# Patient Record
Sex: Male | Born: 1980 | Race: Black or African American | Hispanic: No | Marital: Single | Smoking: Never smoker
Health system: Southern US, Community
[De-identification: ages and names within clinical notes are randomized; demographics above are authoritative.]

## PROBLEM LIST (undated history)

## (undated) DIAGNOSIS — F209 Schizophrenia, unspecified: Secondary | ICD-10-CM

## (undated) DIAGNOSIS — F319 Bipolar disorder, unspecified: Secondary | ICD-10-CM

## (undated) DIAGNOSIS — D573 Sickle-cell trait: Secondary | ICD-10-CM

---

## 2004-01-07 ENCOUNTER — Inpatient Hospital Stay (HOSPITAL_COMMUNITY): Admission: AC | Admit: 2004-01-07 | Discharge: 2004-01-13 | Payer: Self-pay

## 2004-01-13 ENCOUNTER — Inpatient Hospital Stay (HOSPITAL_COMMUNITY)
Admission: RE | Admit: 2004-01-13 | Discharge: 2004-01-28 | Payer: Self-pay | Admitting: Physical Medicine & Rehabilitation

## 2004-02-16 ENCOUNTER — Encounter
Admission: RE | Admit: 2004-02-16 | Discharge: 2004-04-18 | Payer: Self-pay | Admitting: Physical Medicine & Rehabilitation

## 2004-02-17 ENCOUNTER — Ambulatory Visit: Payer: Self-pay | Admitting: Physical Medicine & Rehabilitation

## 2004-11-04 ENCOUNTER — Emergency Department (HOSPITAL_COMMUNITY): Admission: EM | Admit: 2004-11-04 | Discharge: 2004-11-04 | Payer: Self-pay | Admitting: Emergency Medicine

## 2004-11-09 ENCOUNTER — Emergency Department (HOSPITAL_COMMUNITY): Admission: EM | Admit: 2004-11-09 | Discharge: 2004-11-09 | Payer: Self-pay | Admitting: Family Medicine

## 2004-12-09 ENCOUNTER — Emergency Department (HOSPITAL_COMMUNITY): Admission: EM | Admit: 2004-12-09 | Discharge: 2004-12-10 | Payer: Self-pay | Admitting: Emergency Medicine

## 2010-06-20 ENCOUNTER — Emergency Department (HOSPITAL_COMMUNITY)
Admission: EM | Admit: 2010-06-20 | Discharge: 2010-06-20 | Payer: Self-pay | Source: Home / Self Care | Admitting: Family Medicine

## 2010-06-20 ENCOUNTER — Emergency Department (HOSPITAL_COMMUNITY)
Admission: EM | Admit: 2010-06-20 | Discharge: 2010-06-20 | Payer: Self-pay | Source: Home / Self Care | Admitting: Emergency Medicine

## 2010-06-27 LAB — HIV ANTIBODY (ROUTINE TESTING W REFLEX): HIV: NONREACTIVE
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
## 2013-05-06 ENCOUNTER — Encounter (HOSPITAL_COMMUNITY): Payer: Self-pay | Admitting: Emergency Medicine

## 2013-05-06 ENCOUNTER — Emergency Department (EMERGENCY_DEPARTMENT_HOSPITAL)
Admission: EM | Admit: 2013-05-06 | Discharge: 2013-05-07 | Disposition: A | Discharge status: Other Acute Inpatient Hospital | Payer: Medicare Other | Source: Home / Self Care | Attending: Emergency Medicine | Admitting: Emergency Medicine

## 2013-05-06 DIAGNOSIS — F29 Unspecified psychosis not due to a substance or known physiological condition: Secondary | ICD-10-CM | POA: Diagnosis present

## 2013-05-06 DIAGNOSIS — Z0289 Encounter for other administrative examinations: Secondary | ICD-10-CM | POA: Insufficient documentation

## 2013-05-06 DIAGNOSIS — F209 Schizophrenia, unspecified: Secondary | ICD-10-CM | POA: Insufficient documentation

## 2013-05-06 DIAGNOSIS — D573 Sickle-cell trait: Secondary | ICD-10-CM | POA: Insufficient documentation

## 2013-05-06 DIAGNOSIS — F319 Bipolar disorder, unspecified: Secondary | ICD-10-CM | POA: Insufficient documentation

## 2013-05-06 HISTORY — DX: Sickle-cell trait: D57.3

## 2013-05-06 HISTORY — DX: Bipolar disorder, unspecified: F31.9

## 2013-05-06 HISTORY — DX: Schizophrenia, unspecified: F20.9
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
## 2013-05-07 ENCOUNTER — Encounter (HOSPITAL_COMMUNITY): Payer: Self-pay | Admitting: *Deleted

## 2013-05-07 ENCOUNTER — Encounter (HOSPITAL_COMMUNITY): Payer: Self-pay | Admitting: Behavioral Health

## 2013-05-07 ENCOUNTER — Inpatient Hospital Stay (HOSPITAL_COMMUNITY)
Admission: AD | Admit: 2013-05-07 | Discharge: 2013-05-14 | DRG: 885 | Disposition: A | Discharge status: Home / Self Care | Payer: Medicare Other | Source: Intra-hospital | Attending: Psychiatry | Admitting: Psychiatry

## 2013-05-07 DIAGNOSIS — F319 Bipolar disorder, unspecified: Secondary | ICD-10-CM | POA: Diagnosis present

## 2013-05-07 DIAGNOSIS — F209 Schizophrenia, unspecified: Principal | ICD-10-CM | POA: Diagnosis present

## 2013-05-07 DIAGNOSIS — F29 Unspecified psychosis not due to a substance or known physiological condition: Secondary | ICD-10-CM

## 2013-05-07 DIAGNOSIS — F4310 Post-traumatic stress disorder, unspecified: Secondary | ICD-10-CM | POA: Diagnosis present

## 2013-05-07 DIAGNOSIS — D573 Sickle-cell trait: Secondary | ICD-10-CM | POA: Diagnosis present

## 2013-05-07 DIAGNOSIS — F203 Undifferentiated schizophrenia: Secondary | ICD-10-CM | POA: Diagnosis present

## 2013-05-07 LAB — CBC WITH DIFFERENTIAL/PLATELET
Basophils Absolute: 0.1 10*3/uL (ref 0.0–0.1)
HCT: 34.4 % — ABNORMAL LOW (ref 39.0–52.0)
Hemoglobin: 12.7 g/dL — ABNORMAL LOW (ref 13.0–17.0)
Lymphocytes Relative: 20 % (ref 12–46)
MCHC: 36.9 g/dL — ABNORMAL HIGH (ref 30.0–36.0)
Monocytes Relative: 12 % (ref 3–12)
RDW: 16.4 % — ABNORMAL HIGH (ref 11.5–15.5)

## 2013-05-07 LAB — RAPID URINE DRUG SCREEN, HOSP PERFORMED
Barbiturates: NOT DETECTED
Cocaine: NOT DETECTED
Tetrahydrocannabinol: NOT DETECTED

## 2013-05-07 LAB — BASIC METABOLIC PANEL
CO2: 24 mEq/L (ref 19–32)
Chloride: 102 mEq/L (ref 96–112)
Creatinine, Ser: 1.18 mg/dL (ref 0.50–1.35)
Glucose, Bld: 118 mg/dL — ABNORMAL HIGH (ref 70–99)
Potassium: 3.4 mEq/L — ABNORMAL LOW (ref 3.5–5.1)
Sodium: 137 mEq/L (ref 135–145)

## 2013-05-07 MED ORDER — ALUM & MAG HYDROXIDE-SIMETH 200-200-20 MG/5ML PO SUSP: 30.0000 mL | ORAL | Status: DC | PRN

## 2013-05-07 MED ORDER — ACETAMINOPHEN 325 MG PO TABS: 650.0000 mg | ORAL_TABLET | Freq: Four times a day (QID) | ORAL | Status: DC | PRN

## 2013-05-07 MED ORDER — ONDANSETRON HCL 4 MG PO TABS: 4.0000 mg | ORAL_TABLET | Freq: Three times a day (TID) | ORAL | Status: DC | PRN

## 2013-05-07 MED ORDER — MAGNESIUM HYDROXIDE 400 MG/5ML PO SUSP: 30.0000 mL | Freq: Every day | ORAL | Status: DC | PRN

## 2013-05-07 MED ORDER — IBUPROFEN 200 MG PO TABS: 600.0000 mg | ORAL_TABLET | Freq: Three times a day (TID) | ORAL | Status: DC | PRN

## 2013-05-07 MED ORDER — TRAZODONE HCL 50 MG PO TABS
50.0000 mg | ORAL_TABLET | Freq: Every evening | ORAL | Status: DC | PRN
  Filled 2013-05-07: qty 3

## 2013-05-07 MED ORDER — LORAZEPAM 1 MG PO TABS: 1.0000 mg | ORAL_TABLET | Freq: Three times a day (TID) | ORAL | Status: DC | PRN

## 2013-05-07 MED ORDER — ZOLPIDEM TARTRATE 5 MG PO TABS: 5.0000 mg | ORAL_TABLET | Freq: Every evening | ORAL | Status: DC | PRN

## 2013-05-07 NOTE — ED Provider Notes (Signed)
Medical screening examination/treatment/procedure(s) were conducted as a shared visit with non-physician practitioner(s) and myself.  I personally evaluated the patient during the encounter.  EKG Interpretation   None       32 yo male presenting with hx of schizophrenia presenting with police due to bizarre behavior. On exam, nontoxic, but muttering numbers, referring to caregivers as "Mr. Hermelinda Medicus", and talking about fire.  Plan Psychiatric consultation.    Clinical Impression: 1. Schizophrenia       Candyce Churn, MD 05/07/13 2019
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
## 2013-05-07 NOTE — [facility] Counselor (Signed)
Pt will need to be involuntarily committed. Pt.'s current mental capacity prevents him from coherently signing voluntary admission and consent form.
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
## 2013-05-07 NOTE — ED Provider Notes (Signed)
CSN: [id number]     Arrival date & time 05/06/13  2351 History   First MD Initiated Contact with Patient 05/07/13 0033     Chief Complaint  Patient presents with  . Medical Clearance   HPI  History provided by the patient and [employer]. Patient is a 32 year old male with past history of schizophrenia and bipolar disorder who presents in the custody of [employer]. Patient was found on someone's property after they returned home. Initially they thought the patient may be a burglar however when police arrived patient was mumbling and counting numbers and not making sense to the officers. Patient reports to me that he is homeless and had his sleeping gear on someone's property.  He states this is caused by "social services spawning the root of the problem" and that things would be improved if "a woman ran things". Patient denies taking any medications. Denies any drug or alcohol use. He denies any SI or HI. No other aggravating or alleviating factors. No other associated symptoms.   Past Medical History  Diagnosis Date  . Sickle cell trait   . Bipolar 1 disorder   . Schizophrenia    History reviewed. No pertinent past surgical history. No family history on file. History  Substance Use Topics  . Smoking status: Never Smoker   . Smokeless tobacco: Not on file  . Alcohol Use: No    Review of Systems  Constitutional: Negative for fever.  Respiratory: Negative for shortness of breath.   Cardiovascular: Negative for chest pain.  Neurological: Negative for headaches.  All other systems reviewed and are negative.    Allergies  Review of patient's allergies indicates no known allergies.  Home Medications  No current outpatient prescriptions on file. BP 127/82  Pulse 95  Temp(Src) 99 F (37.2 C)  Resp 16  SpO2 97% Physical Exam  Nursing note and vitals reviewed. Constitutional: He is oriented to person, place, and time. He appears well-developed and well-nourished.  HENT:  Head: Normocephalic  and atraumatic.  Eyes: Conjunctivae and EOM are normal. Pupils are equal, round, and reactive to light.  Neck: Normal range of motion. Neck supple.  No meningeal signs  Cardiovascular: Normal rate and regular rhythm.   Pulmonary/Chest: Effort normal and breath sounds normal. No respiratory distress. He has no wheezes. He has no rales.  Abdominal: Soft.  Musculoskeletal: Normal range of motion.  Neurological: He is alert and oriented to person, place, and time.  Skin: Skin is warm.  Psychiatric: His behavior is normal. His mood appears anxious. Thought content is paranoid. He expresses no homicidal and no suicidal ideation.    ED Course  Procedures   DIAGNOSTIC STUDIES: Oxygen Saturation is 97% on room air.    COORDINATION OF CARE:  Nursing notes reviewed. Vital signs reviewed. Initial pt interview and examination performed.   1:33 AM-patient seen and evaluated. Patient with reported history of schizophrenia and bipolar disorder currently not on any medication. Patient with flight of ideas and some paranoia. Denies SI or HI. Discussed work up plan with pt at bedside, which includes medical clearance and TTS evaluation. Pt agrees with plan.  Patient medically cleared without signs for emergent medical condition. He is stable for further psychiatric evaluation. Psychiatric holding orders in place. TTS consult ordered.     Results for orders placed during the hospital encounter of 05/06/13  CBC WITH DIFFERENTIAL      Result Value Range   WBC 12.0 (*) 4.0 - 10.5 K/uL   RBC 4.60  4.22 - 5.81 MIL/uL   Hemoglobin 12.7 (*) 13.0 - 17.0 g/dL   HCT 42.5 (*) 95.6 - 38.7 %   MCV 74.8 (*) 78.0 - 100.0 fL   MCH 27.6  26.0 - 34.0 pg   MCHC 36.9 (*) 30.0 - 36.0 g/dL   RDW 56.4 (*) 33.2 - 95.1 %   Platelets 304  150 - 400 K/uL   Neutrophils Relative % 66  43 - 77 %   Lymphocytes Relative 20  12 - 46 %   Monocytes Relative 12  3 - 12 %   Eosinophils Relative 1  0 - 5 %   Basophils Relative  1  0 - 1 %   Neutro Abs 8.0 (*) 1.7 - 7.7 K/uL   Lymphs Abs 2.4  0.7 - 4.0 K/uL   Monocytes Absolute 1.4 (*) 0.1 - 1.0 K/uL   Eosinophils Absolute 0.1  0.0 - 0.7 K/uL   Basophils Absolute 0.1  0.0 - 0.1 K/uL   RBC Morphology POLYCHROMASIA PRESENT    BASIC METABOLIC PANEL      Result Value Range   Sodium 137  135 - 145 mEq/L   Potassium 3.4 (*) 3.5 - 5.1 mEq/L   Chloride 102  96 - 112 mEq/L   CO2 24  19 - 32 mEq/L   Glucose, Bld 118 (*) 70 - 99 mg/dL   BUN 16  6 - 23 mg/dL   Creatinine, Ser 8.84  0.50 - 1.35 mg/dL   Calcium 9.2  8.4 - 16.6 mg/dL   GFR calc non Af Amer 81 (*) >90 mL/min   GFR calc Af Amer >90  >90 mL/min  URINE RAPID DRUG SCREEN (HOSP PERFORMED)      Result Value Range   Opiates NONE DETECTED  NONE DETECTED   Cocaine NONE DETECTED  NONE DETECTED   Benzodiazepines NONE DETECTED  NONE DETECTED   Amphetamines NONE DETECTED  NONE DETECTED   Tetrahydrocannabinol NONE DETECTED  NONE DETECTED   Barbiturates NONE DETECTED  NONE DETECTED  ETHANOL      Result Value Range   Alcohol, Ethyl (B) <11  0 - 11 mg/dL        MDM   1. Schizophrenia        Angus Seller, PA-C 05/07/13 0424

## 2013-05-07 NOTE — BHH Counselor (Signed)
[facility] called and said that pt's bed is ready, 405-1 Donell Sievert PA-C to [employer]. Writer notified pt's RN. Pt declined signing consent to release info form. Writer notified [facility] extenders in Psych ED.  Evette Cristal, [place] Assessment Counselor
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
## 2013-05-07 NOTE — Progress Notes (Signed)
Admission note: Pt presents with rapid, pressured speech, disorganized thoughts, flight of ideas and poor insight. Pt is delusional. Pt stated, "Have you seen the movie Jumpers, the people in this hospital are crazy just like that movie". Pt stated that he took medications when he was little and it made him crazy. Per pt, he's not taking any meds while he is here. Pt had difficulty concentrating and staying on task during admission process. During skin assessment, pt would not put on gown. Pt stood behind the curtain whit his hands up in the air w/o the gown on and stated to writer "you are the first person to see me naked, I'm a virgin". Pt denies using alcohol or any illicit drugs. Pt explained the policy here at [facility]. Pt safely brought onto the unit. Pt safety maintained.

## 2013-05-07 NOTE — ED Provider Notes (Signed)
Medical screening examination/treatment/procedure(s) were conducted as a shared visit with non-physician practitioner(s) and myself.  I personally evaluated the patient during the encounter.   Please see my separate note.     Candyce Churn, MD 05/07/13 2043

## 2013-05-07 NOTE — Care Management Utilization Note (Signed)
Per State Regulation 482.30  The chart was reviewed for necessity with respect to the patient's Admission/ Duration of stay.Admission 05/07/2013  Next Review Date:05/10/2013  Lacinda Axon, RN, BSN

## 2013-05-07 NOTE — ED Notes (Signed)
Patient was transferred by [facility] to [facility] room 405-01. Patient is IVC and [employer] received IVC paperwork in hand. Patient left with no signs of distress noted. Patient belongings were returned to patient. Patient denies SI/HI and A/V hallucinations and denies pain. Hand off report was given to Financial controller at [facility].

## 2013-05-07 NOTE — BHH Group Notes (Signed)
[facility] Group Therapy  05/07/2013 , 3:04 PM    Type of Therapy:  [facility] Presentation  Participation Level:  Active  Participation Quality:  Attentive  Affect:  Blunted  Cognitive:  Oriented  Insight:  Limited  Engagement in Therapy:  Engaged  Modes of Intervention:  Discussion, Education and Socialization  Summary of Progress/Problems:  Onalee Hua from [employer] came to present his recovery story and play the guitar.   Sat quietly through group.  Nodded off several times.  [name] B 05/07/2013 , 3:04 PM

## 2013-05-07 NOTE — Tx Team (Signed)
Initial Interdisciplinary Treatment Plan  PATIENT STRENGTHS: (choose at least two) Ability for insight Supportive family/friends  PATIENT STRESSORS: homeless   PROBLEM LIST: Problem List/Patient Goals Date to be addressed Date deferred Reason deferred Estimated date of resolution  Rapid pressured speech 05/07/13     Delusions  05/07/13                                                DISCHARGE CRITERIA:  Ability to meet basic life and health needs Adequate post-discharge living arrangements Improved stabilization in mood, thinking, and/or behavior Verbal commitment to aftercare and medication compliance  PRELIMINARY DISCHARGE PLAN: Attend aftercare/continuing care group Attend PHP/IOP  PATIENT/FAMIILY INVOLVEMENT: This treatment plan has been presented to and reviewed with the patient, Demarr Kluever, and/or family member.  The patient and family have been given the opportunity to ask questions and make suggestions.  Woodford Strege L 05/07/2013, 12:10 PM

## 2013-05-07 NOTE — ED Notes (Signed)
Patient denies SI, HI, AVH. Oriented x3. Unable to contract for safety. States he would need to think about it and "would want a relationship". Denies anxiety, hopelessness and depression. Denies drug and alcohol use. Patient is unfocused, tangential. States he has no support system and has not had housing since 2008. Patient states " the police arrived when I was trying to take a nap". Patient states he does not know why the police brought him to the hospital.  Patient safety maintained, Q 15 minute checks.

## 2013-05-07 NOTE — Progress Notes (Signed)
D: Patient in his room asleep on approach.  Patient appears paranoid and he is preoccupied with his clothing.  Patient forward little information and he is guarded.  Patient asked about his clothing several times but writer did not see clothing in the laundry room or in his room.  Patient denies SI/HI and denies AVH but patient paranoid.   A: Staff to monitor Q 15 mins for safety.  Encouragement and support offered.  No scheduled medications administered per orders. R: Patient remains safe on the unit.  Patient did not attend group tonight.  Patient did not have scheduled medications tonight.  Patient not visible on the unit tonight.
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
## 2013-05-08 DIAGNOSIS — F4310 Post-traumatic stress disorder, unspecified: Secondary | ICD-10-CM | POA: Diagnosis present

## 2013-05-08 DIAGNOSIS — F203 Undifferentiated schizophrenia: Secondary | ICD-10-CM | POA: Diagnosis present

## 2013-05-08 MED ORDER — OLANZAPINE 10 MG PO TBDP
10.0000 mg | ORAL_TABLET | Freq: Three times a day (TID) | ORAL | Status: DC | PRN
  Administered 2013-05-09 – 2013-05-11 (×2): 10 mg via ORAL
  Filled 2013-05-08 (×3): qty 1

## 2013-05-08 MED ORDER — CITALOPRAM HYDROBROMIDE 10 MG PO TABS
10.0000 mg | ORAL_TABLET | Freq: Every day | ORAL | Status: DC
  Administered 2013-05-10 – 2013-05-14 (×5): 10 mg via ORAL
  Filled 2013-05-08 (×2): qty 1
  Filled 2013-05-08: qty 3
  Filled 2013-05-08 (×6): qty 1
  Filled 2013-05-08: qty 3

## 2013-05-08 MED ORDER — HALOPERIDOL 2 MG PO TABS
2.0000 mg | ORAL_TABLET | Freq: Every day | ORAL | Status: DC
  Filled 2013-05-08 (×5): qty 1

## 2013-05-08 NOTE — BHH Suicide Risk Assessment (Signed)
Suicide Risk Assessment  Admission Assessment     Nursing information obtained from:  Patient Demographic factors:  Male;Low socioeconomic status Current Mental Status:  NA Loss Factors:  NA Historical Factors:  NA Risk Reduction Factors:  NA  CLINICAL FACTORS:   Depression:   Delusional Hopelessness Schizophrenia:   Paranoid or undifferentiated type Currently Psychotic  COGNITIVE FEATURES THAT CONTRIBUTE TO RISK:  Closed-mindedness Polarized thinking    SUICIDE RISK:   Minimal: No identifiable suicidal ideation.  Patients presenting with no risk factors but with morbid ruminations; may be classified as minimal risk based on the severity of the depressive symptoms  PLAN OF CARE:1. Admit for crisis management and stabilization. 2. Medication management to reduce current symptoms to base line and improve the     patient's overall level of functioning 3. Treat health problems as indicated. 4. Develop treatment plan to decrease risk of relapse upon discharge and the need for     readmission. 5. Psycho-social education regarding relapse prevention and self care. 6. Health care follow up as needed for medical problems. 7. Restart home medications where appropriate.   I certify that inpatient services furnished can reasonably be expected to improve the patient's condition.  Thedore Mins, MD 05/08/2013, 11:41 AM
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
## 2013-05-09 DIAGNOSIS — F4310 Post-traumatic stress disorder, unspecified: Secondary | ICD-10-CM

## 2013-05-09 DIAGNOSIS — F209 Schizophrenia, unspecified: Principal | ICD-10-CM

## 2013-05-09 NOTE — Progress Notes (Signed)
D. Pt visible in milieu this evening, attended evening wrap-up group and spoke about how he is fine and feels "great". Pt does appear somewhat guarded and paranoid and refused hs haldol. A. Explained and educated pt about the medications and pt verbalized he was in [place] in the past and was on zyprexa and agreed to take that instead of the haldol. Drenda Freeze, NP, notified of request and said it was ok to give zyprexa that was ordered PRN. R. Pt did take zyprexa without incident this evening.
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
## 2013-05-09 NOTE — Progress Notes (Signed)
D  Pt is disorganized and preoccupied in his thought processes   He talks about imprinting and that he doesn't need to take medications   He refused his nighttime medications   He has been isolative to his room   He is polite and cooperative when approached but insists he does not need to be here and that everything was good for him A   Verbal support    Medications offered and discussed theraputic use   Q 15 min checks R   Pt safe at present

## 2013-05-09 NOTE — Progress Notes (Signed)
D: Patient denies SI/HI and auditory and visual hallucinations. The patient has a preoccupied mood and affect. The patient is paranoid today and is guarded when interacting with staff. However, when interacting with staff the patient remains pleasant. The patient states that he "doesn't need medication" and that he "will not take it." The patient is attending groups on the unit.   A: Patient given emotional support from RN. Patient encouraged to come to staff with concerns and/or questions. Patient's medication routine continued. Patient's orders and plan of care reviewed.  R: Patient remains safe. Will continue to monitor patient q15 minutes for safety.

## 2013-05-09 NOTE — Progress Notes (Signed)
The focus of this group is to help patients review their daily goal of treatment and discuss progress on daily workbooks. Pt did not attend this group.

## 2013-05-10 NOTE — [facility] Group Notes (Signed)
[facility] Group Notes: (Clinical Social Work)   05/10/2013      Type of Therapy:  Group Therapy   Participation Level:  Did Not Attend    Ambrose Mantle, LCSW 05/10/2013, 12:43 PM

## 2013-05-10 NOTE — Progress Notes (Signed)
Patient ID: Albert Anderson, male   DOB: Oct 12, 1980, 32 y.o.   MRN: [id number] Psychoeducational Group Note  Date:  05/10/2013 Time:0930am  Group Topic/Focus:  Identifying Needs:   The focus of this group is to help patients identify their personal needs that have been historically problematic and identify healthy behaviors to address their needs.  Participation Level:  Did Not Attend  Participation Quality:    Affect: Cognitive:  Insight: Engagement in Group:  Additional Comments:  Psychoeducational group - healthy coping skills  Valente David 05/10/2013,9:58 AM

## 2013-05-10 NOTE — Progress Notes (Signed)
Patient ID: Albert Anderson, male   DOB: [birth year], 32 y.o.   MRN: [id number] [facility] MD Progress Note  05/10/2013 10:14 AM Albert Anderson  MRN:  [id number] Subjective:  Patient seen in his room in bed. He is drowsy and sluggish. States he just took his zyprexa and he isn't going to take anything else. He pulls the covers up and rolls over. He does not feel he needs to take his medication. He says he is fine and he is ready to go. Objective: Patient has not been attending groups, has been refusing medication. He has no insight, continues to be delusional, but otherwise cooperative.  Diagnosis:   DSM5: Schizophrenia Disorders: Schizophrenia (295.7)  Obsessive-Compulsive Disorders:  Trauma-Stressor Disorders: Posttraumatic Stress Disorder (309.81)  Substance/Addictive Disorders:  Depressive Disorders:  AXIS I: Undifferentiated schizophrenia  AXIS II: Deferred  AXIS III:  Past Medical History   Diagnosis  Date   .  Sickle cell trait    .  Bipolar 1 disorder    .  Schizophrenia     AXIS IV: economic problems, housing problems, occupational problems, other psychosocial or environmental problems and problems with primary support group  AXIS V: 31-40 impairment in reality testing   ADL's:  Intact  Sleep: Good  Appetite:  Good  Suicidal Ideation:  Denies Homicidal Ideation:  Denies AEB (as evidenced by):  Psychiatric Specialty Exam: Review of Systems  Constitutional: Negative.   HENT: Negative.   Eyes: Negative.   Respiratory: Negative.   Cardiovascular: Negative.   Gastrointestinal: Negative.   Genitourinary: Negative.   Musculoskeletal: Negative.   Skin: Negative.   Neurological: Negative.   Endo/Heme/Allergies: Negative.     Blood pressure 115/70, pulse 93, temperature 98.2 F (36.8 C), temperature source Oral, resp. rate 18, height 5\' 5"  (1.651 m), weight 72.576 kg (160 lb).Body mass index is 26.63 kg/(m^2).  General Appearance: Casual  Eye Contact::  Fair  Speech:   Clear and Coherent  Volume:  Normal  Mood:  Depressed  Affect:  Blunt  Thought Process:  Disorganized  Orientation:  Full (Time, Place, and Person)  Thought Content:  Delusions and Paranoid Ideation  Suicidal Thoughts:  No  Homicidal Thoughts:  No  Memory:  Immediate;   Fair Recent;   Fair Remote;   Fair  Judgement:  Impaired  Insight:  Lacking  Psychomotor Activity:  Normal  Concentration:  Fair  Recall:  Fair  Akathisia:  No  Handed:  Right  AIMS (if indicated):     Assets:  Communication Skills Desire for Improvement Leisure Time Physical Health Resilience  Sleep:  Number of Hours: 6.5   Current Medications: Current Facility-Administered Medications  Medication Dose Route Frequency Provider Last Rate Last Dose  . acetaminophen (TYLENOL) tablet 650 mg  650 mg Oral Q6H PRN Shuvon Rankin, NP      . alum & mag hydroxide-simeth (MAALOX/MYLANTA) 200-200-20 MG/5ML suspension 30 mL  30 mL Oral Q4H PRN Shuvon Rankin, NP      . citalopram (CELEXA) tablet 10 mg  10 mg Oral Daily Mojeed Akintayo   10 mg at 05/10/13 0817  . haloperidol (HALDOL) tablet 2 mg  2 mg Oral QHS Mojeed Akintayo      . magnesium hydroxide (MILK OF MAGNESIA) suspension 30 mL  30 mL Oral Daily PRN Shuvon Rankin, NP      . OLANZapine zydis (ZYPREXA) disintegrating tablet 10 mg  10 mg Oral Q8H PRN Mojeed Akintayo   10 mg at 05/09/13 2154  . traZODone (DESYREL) tablet 50  mg  50 mg Oral QHS PRN Fransisca Kaufmann, NP        Lab Results: No results found for this or any previous visit (from the past 48 hour(s)).  Physical Findings: AIMS: Facial and Oral Movements Muscles of Facial Expression: None, normal Lips and Perioral Area: None, normal Jaw: None, normal Tongue: None, normal,Extremity Movements Upper (arms, wrists, hands, fingers): None, normal Lower (legs, knees, ankles, toes): None, normal, Trunk Movements Neck, shoulders, hips: None, normal, Overall Severity Severity of abnormal movements (highest score from  questions above): None, normal Incapacitation due to abnormal movements: None, normal Patient's awareness of abnormal movements (rate only patient's report): No Awareness, Dental Status Current problems with teeth and/or dentures?: No Does patient usually wear dentures?: No  CIWA:    COWS:     Treatment Plan Summary: Daily contact with patient to assess and evaluate symptoms and progress in treatment Medication management  Plan: 1. Will increase dose or change to Risperdal or Haldol if second opinion is completed. 2. Will follow. Medical Decision Making Problem Points:  Established problem, stable/improving (1) and Review of psycho-social stressors (1) Data Points:  Review of medication regiment & side effects (2)  I certify that inpatient services furnished can reasonably be expected to improve the patient's condition.   Rona Ravens. Mashburn RPAC 05/10/2013, 10:14 AM  Attending Addendum:  I have discussed this patient with the above provider. I have reviewed the history, physical exam, assessment and plan and agree with the above.  Jacqulyn Cane, M.D.  05/10/2013 10:14 AM

## 2013-05-10 NOTE — Progress Notes (Signed)
D: Patient lying in bed with eyes closed. Respirations even and non-labored. A: Staff will monitor on q 15 minute checks, follow treatment plan, and give meds as ordered. R: Appears asleep  

## 2013-05-10 NOTE — Progress Notes (Signed)
Did not attend group 

## 2013-05-10 NOTE — Progress Notes (Signed)
Patient ID: Albert Anderson, male   DOB: April 04, 1981, 32 y.o.   MRN: [id number] 05-10-13 nursing shift note: D: when pt came to the medication window he was very resistant to taking medications. He was lethargic and seemed preoccupied with his thoughts. It was difficult to get and keep his attention. A: after some encouragement from RN and other staff the pt took his medication. R: he remains in his room, is not coming to groups or interacting with other.  He remains disorganized and delusional. RN will monitor and Q 15 min ck's continue.

## 2013-05-11 MED ORDER — OLANZAPINE 5 MG PO TABS
5.0000 mg | ORAL_TABLET | Freq: Once | ORAL | Status: AC
  Administered 2013-05-11: 5 mg via ORAL
  Filled 2013-05-11 (×2): qty 1

## 2013-05-11 MED ORDER — OLANZAPINE 7.5 MG PO TABS
15.0000 mg | ORAL_TABLET | Freq: Every day | ORAL | Status: DC
  Administered 2013-05-12 – 2013-05-13 (×2): 15 mg via ORAL
  Filled 2013-05-11 (×4): qty 2

## 2013-05-11 NOTE — Progress Notes (Signed)
Patient ID: Bain Whichard, male   DOB: May 16, 1981, 32 y.o.   MRN: [id number] Psychoeducational Group Note  Date:  05/11/2013 Time:  1000am  Group Topic/Focus:  Making Healthy Choices:   The focus of this group is to help patients identify negative/unhealthy choices they were using prior to admission and identify positive/healthier coping strategies to replace them upon discharge.  Participation Level:  Minimal  Participation Quality:  Appropriate  Affect:  Labile  Cognitive:  Disorganized  Insight:  Resistant  Engagement in Group:  Resistant  Additional Comments:  Inventory and Psychoeducational group   Valente David 05/11/2013,11:22 AM

## 2013-05-11 NOTE — Progress Notes (Signed)
Adult Psychoeducational Group Note  Date:  05/11/2013 Time:  9:10 PM  Group Topic/Focus:  Wrap-Up Group:   The focus of this group is to help patients review their daily goal of treatment and discuss progress on daily workbooks.  Participation Level:  Minimal  Participation Quality:  Drowsy  Affect:  Flat  Cognitive:  Lacking  Insight: Lacking  Engagement in Group:  Lacking  Modes of Intervention:  Discussion  Additional Comments:  Pt stated that he a boring day.  Pt also mention that his goal was to try to stay focus on life.  Pt stated that he has a good support sytem that includes his family  [name] 05/11/2013, 9:10 PM

## 2013-05-11 NOTE — Progress Notes (Signed)
Spoke briefly with pt as he does not engage other than to ask about "the meds I have to take." Although pt is not agitated, did offer zyprexa prn to aid in sleep. Pt focused on doing what he needs to do for discharge. Asking "if I don't take the medicine will you force an injection on me?" Little to no insight. Did attend group. Forwarded little info with this Clinical research associate outside of denying all psychiatric symptoms. Remains preoccupied and paranoid. Lawrence Marseilles

## 2013-05-11 NOTE — Progress Notes (Signed)
Patient ID: Albert Anderson, male   DOB: 1981-05-12, 32 y.o.   MRN: [id number] [facility] MD Progress Note  05/11/2013 7:53 PM Albert Anderson  MRN:  [id number] Subjective: Patient is observed in group as well as seen 1:1. He is continuing to be resistant to medication, but has remained cooperative. He states he took his Zyprexa yesterday, but med rec does not support this. He does not want to take the Haldol but will agree to take the Zyprexa.  He has taken 10mg  of Zyprexa this AM.   Objective: Patient has not been attending groups, has been refusing medication. He has no insight, continues to be delusional, but otherwise cooperative.  Diagnosis:  Albert Anderson continues to be disorganized. He is cooperative with this provider and agrees to take Zyprexa if given daily. He mentions a number of sexually inappropriate statements, but can be redirected. DSM5: Schizophrenia Disorders: Schizophrenia (295.7)  Obsessive-Compulsive Disorders:  Trauma-Stressor Disorders: Posttraumatic Stress Disorder (309.81)  Substance/Addictive Disorders:  Depressive Disorders:  AXIS I: Undifferentiated schizophrenia  AXIS II: Deferred  AXIS III:  Past Medical History   Diagnosis  Date   .  Sickle cell trait    .  Bipolar 1 disorder    .  Schizophrenia     AXIS IV: economic problems, housing problems, occupational problems, other psychosocial or environmental problems and problems with primary support group  AXIS V: 31-40 impairment in reality testing   ADL's:  Intact  Sleep: Good  Appetite:  Good  Suicidal Ideation:  Denies Homicidal Ideation:  Denies AEB (as evidenced by):  Psychiatric Specialty Exam: Review of Systems  Constitutional: Negative.   HENT: Negative.   Eyes: Negative.   Respiratory: Negative.   Cardiovascular: Negative.   Gastrointestinal: Negative.   Genitourinary: Negative.   Musculoskeletal: Negative.   Skin: Negative.   Neurological: Negative.   Endo/Heme/Allergies: Negative.      Blood pressure 119/76, pulse 53, temperature 97.5 F (36.4 C), temperature source Oral, resp. rate 17, height 5\' 5"  (1.651 m), weight 72.576 kg (160 lb).Body mass index is 26.63 kg/(m^2).  General Appearance: Casual  Eye Contact::  Fair  Speech:  Clear and Coherent  Volume:  Normal  Mood:  Depressed  Affect:  Blunt  Thought Process:  Disorganized  Orientation:  Full (Time, Place, and Person)  Thought Content:  Delusions and Paranoid Ideation  Suicidal Thoughts:  No  Homicidal Thoughts:  No  Memory:  Immediate;   Fair Recent;   Fair Remote;   Fair  Judgement:  Impaired  Insight:  Lacking  Psychomotor Activity:  Normal  Concentration:  Fair  Recall:  Fair  Akathisia:  No  Handed:  Right  AIMS (if indicated):     Assets:  Communication Skills Desire for Improvement Leisure Time Physical Health Resilience  Sleep:  Number of Hours: 6   Current Medications: Current Facility-Administered Medications  Medication Dose Route Frequency Provider Last Rate Last Dose  . acetaminophen (TYLENOL) tablet 650 mg  650 mg Oral Q6H PRN Shuvon Rankin, NP      . alum & mag hydroxide-simeth (MAALOX/MYLANTA) 200-200-20 MG/5ML suspension 30 mL  30 mL Oral Q4H PRN Shuvon Rankin, NP      . citalopram (CELEXA) tablet 10 mg  10 mg Oral Daily Mojeed Akintayo   10 mg at 05/11/13 0812  . magnesium hydroxide (MILK OF MAGNESIA) suspension 30 mL  30 mL Oral Daily PRN Shuvon Rankin, NP      . [START ON 05/12/2013] OLANZapine (ZYPREXA) tablet 15 mg  15  mg Oral Daily Verne Spurr, PA-C      . OLANZapine zydis (ZYPREXA) disintegrating tablet 10 mg  10 mg Oral Q8H PRN Mojeed Akintayo   10 mg at 05/11/13 0900  . traZODone (DESYREL) tablet 50 mg  50 mg Oral QHS PRN Fransisca Kaufmann, NP        Lab Results: No results found for this or any previous visit (from the past 48 hour(s)).  Physical Findings: AIMS: Facial and Oral Movements Muscles of Facial Expression: None, normal Lips and Perioral Area: None, normal Jaw:  None, normal Tongue: None, normal,Extremity Movements Upper (arms, wrists, hands, fingers): None, normal Lower (legs, knees, ankles, toes): None, normal, Trunk Movements Neck, shoulders, hips: None, normal, Overall Severity Severity of abnormal movements (highest score from questions above): None, normal Incapacitation due to abnormal movements: None, normal Patient's awareness of abnormal movements (rate only patient's report): No Awareness, Dental Status Current problems with teeth and/or dentures?: No Does patient usually wear dentures?: No  CIWA:    COWS:     Treatment Plan Summary: Daily contact with patient to assess and evaluate symptoms and progress in treatment Medication management  Plan: 1. Zyprexa 5mg  is given 1 time today for a total dose of 15mg . 2. Haldol is d/c'd. 3. Celexa is continued. 4. Will continue to monitor. Medical Decision Making Problem Points:  Established problem, stable/improving (1) and Review of psycho-social stressors (1) Data Points:  Review of medication regiment & side effects (2)  I certify that inpatient services furnished can reasonably be expected to improve the patient's condition.   Rona Ravens. Mashburn RPAC 05/11/2013, 7:53 PM  Attending Addendum:  I have discussed this patient with the above provider. I have reviewed the history, physical exam, assessment and plan and agree with the above.  Jacqulyn Cane, M.D.  05/11/2013 9:59 PM

## 2013-05-11 NOTE — Progress Notes (Signed)
Pt remains disorganized, preoccupied and suspicious. Refusing haldol as it's "used to put people down." States he will take zyprexa though when offered prn dose at hs pt states he does not need it. Med education given along with support but pt resistant. Pt went on to bed and has slept without difficulty. Denies SI/HI/AVH and remains safe. Lawrence Marseilles

## 2013-05-11 NOTE — [facility] Group Notes (Signed)
[facility] Group Notes:  (Clinical Social Work)  05/11/2013   11:15am-12:00pm  Summary of Progress/Problems:  The main focus of today's process group was to listen to a variety of genres of music and to identify that different types of music provoke different responses.  The patient then was able to identify personally what was soothing for them, as well as energizing.  Handouts were used to record feelings evoked, as well as how patient can personally use this knowledge in sleep habits, with depression, and with other symptoms.  The patient was unable, although called on numerous times, to express the feelings evoked by various kinds of music, instead engaging in lengthy intellectual but disorganized diatribes about the meaning of the music.  Redirection was not successful with him.  Type of Therapy:  Music Therapy   Participation Level:  Active  Participation Quality:  Attentive and Sharing  Affect:  Blunted  Cognitive:  Disorganized  Insight:  Lacking  Engagement in Therapy:  Limited  Modes of Intervention:   Activity, Exploration  Ambrose Mantle, LCSW 05/11/2013, 12:30pm

## 2013-05-11 NOTE — Progress Notes (Signed)
Patient ID: Albert Anderson, male   DOB: 03-30-1981, 32 y.o.   MRN: [id number] 05-11-13 nursing shift note: D: pt is very resistant to taking medications. He remains disorganized and paranoid. He ate his breakfast and had no complaints of pain. He is going to groups. A: RN did 1:1 with the patient. He denied any si/hi. He was administered Zyprexa prn for anxiety/agitation. R: his Zyprexa reduced his anxiety/ agitation. On his inventory sheet he wrote: slept fair, appetite good, energy normal, attention good with depression and hopelessness both at 4. Physical problems have been lightheadedness. RN will monitor and Q 15 min ck's continue.

## 2013-05-12 NOTE — Progress Notes (Signed)
D: Pt presents anxious this morning. No insight for treatment. Cautious about taking meds. Pt suspicious about the med labels and pills. Pt spent a few minutes examining the pill packages and pills before taking his morning meds. Pt denies SI/HI/AVH. Pt voiced concerns about his belongings that he left at the [facility] center prior to admission. Pt [facility] to [facility]. A: Medications administered as ordered per MD. Margot Ables support given. Pt encouraged to attend groups. 15 minute checks performed for safety. R: Pt is paranoid and forwards little information.  Pt safety maintained.

## 2013-05-12 NOTE — Progress Notes (Signed)
Patient ID: Koven Belinsky, male   DOB: 1980/11/06, 32 y.o.   MRN: [id number] [facility] MD Progress Note  05/12/2013 10:39 AM Dayson Aboud  MRN:  [id number] Subjective:  Patient states "I'm just taking the medication because I have to. They aren't going to help this machine that is attached to my head. Maybe one day if I am ever in a co-ed relationship then they can help me figure out my mental health. I need to call the eye care place where I was staying to find my stuff. Can you help me with that."   Objective:  Patient continues to be very disorganized in his thought processes and expresses many delusions. He stood up during interview to Doctor, hospital some marks on his back that he feels is part of a "machine" that is affecting him but is unable to stay how. Patient took his scheduled Zyprexa this morning but makes it clear it's only "because I have to." Patient is not demonstrating any behavioral problems but continues to not be invested in his treatment nor has he developed any insight into his problems. Harrell has been attending more groups but has trouble meaningfully expressing himself due to his extremely disorganized thought processes. Patient reported to be making frequent request of staff to make phone call in order to locate his personal items such as his sleeping bags.   Diagnosis:  DSM5: Schizophrenia Disorders: Schizophrenia (295.7)  Obsessive-Compulsive Disorders:  Trauma-Stressor Disorders: Posttraumatic Stress Disorder (309.81)  Substance/Addictive Disorders:  Depressive Disorders:  AXIS I: Undifferentiated schizophrenia  AXIS II: Deferred  AXIS III:  Past Medical History   Diagnosis  Date   .  Sickle cell trait    .  Bipolar 1 disorder    .  Schizophrenia     AXIS IV: economic problems, housing problems, occupational problems, other psychosocial or environmental problems and problems with primary support group  AXIS V: 31-40 impairment in reality testing   ADL's:   Intact  Sleep: Good  Appetite:  Good  Suicidal Ideation:  Denies Homicidal Ideation:  Denies AEB (as evidenced by):  Psychiatric Specialty Exam: Review of Systems  Constitutional: Negative.   HENT: Negative.   Eyes: Negative.   Respiratory: Negative.   Cardiovascular: Negative.   Gastrointestinal: Negative.   Genitourinary: Negative.   Musculoskeletal: Negative.   Skin: Negative.   Neurological: Negative.   Endo/Heme/Allergies: Negative.   Psychiatric/Behavioral: Positive for depression and hallucinations. Negative for suicidal ideas, memory loss and substance abuse. The patient is nervous/anxious. The patient does not have insomnia.     Blood pressure 113/71, pulse 83, temperature 97.9 F (36.6 C), temperature source Oral, resp. rate 20, height 5\' 5"  (1.651 m), weight 72.576 kg (160 lb).Body mass index is 26.63 kg/(m^2).  General Appearance: Casual  Eye Contact::  Fair  Speech:  Clear and Coherent  Volume:  Normal  Mood:  Depressed  Affect:  Blunt  Thought Process:  Disorganized  Orientation:  Full (Time, Place, and Person)  Thought Content:  Delusions and Paranoid Ideation  Suicidal Thoughts:  No  Homicidal Thoughts:  No  Memory:  Immediate;   Fair Recent;   Fair Remote;   Fair  Judgement:  Impaired  Insight:  Lacking  Psychomotor Activity:  Normal  Concentration:  Fair  Recall:  Fair  Akathisia:  No  Handed:  Right  AIMS (if indicated):     Assets:  Communication Skills Desire for Improvement Leisure Time Physical Health Resilience  Sleep:  Number of Hours: 6.25  Current Medications: Current Facility-Administered Medications  Medication Dose Route Frequency Provider Last Rate Last Dose  . acetaminophen (TYLENOL) tablet 650 mg  650 mg Oral Q6H PRN Shuvon Rankin, NP      . alum & mag hydroxide-simeth (MAALOX/MYLANTA) 200-200-20 MG/5ML suspension 30 mL  30 mL Oral Q4H PRN Shuvon Rankin, NP      . citalopram (CELEXA) tablet 10 mg  10 mg Oral Daily Mojeed  Akintayo   10 mg at 05/12/13 0747  . magnesium hydroxide (MILK OF MAGNESIA) suspension 30 mL  30 mL Oral Daily PRN Shuvon Rankin, NP      . OLANZapine (ZYPREXA) tablet 15 mg  15 mg Oral Daily Verne Spurr, PA-C   15 mg at 05/12/13 0746  . OLANZapine zydis (ZYPREXA) disintegrating tablet 10 mg  10 mg Oral Q8H PRN Mojeed Akintayo   10 mg at 05/11/13 0900  . traZODone (DESYREL) tablet 50 mg  50 mg Oral QHS PRN Fransisca Kaufmann, NP        Lab Results: No results found for this or any previous visit (from the past 48 hour(s)).  Physical Findings: AIMS: Facial and Oral Movements Muscles of Facial Expression: None, normal Lips and Perioral Area: None, normal Jaw: None, normal Tongue: None, normal,Extremity Movements Upper (arms, wrists, hands, fingers): None, normal Lower (legs, knees, ankles, toes): None, normal, Trunk Movements Neck, shoulders, hips: None, normal, Overall Severity Severity of abnormal movements (highest score from questions above): None, normal Incapacitation due to abnormal movements: None, normal Patient's awareness of abnormal movements (rate only patient's report): No Awareness, Dental Status Current problems with teeth and/or dentures?: No Does patient usually wear dentures?: No  CIWA:    COWS:     Treatment Plan Summary: Daily contact with patient to assess and evaluate symptoms and progress in treatment Medication management  Plan: Continue crisis management and stabilization.  Medication management: Reviewed with patient who stated no untoward effects. Continue Zyprexa 15 mg daily for psychosis and Celexa 10 mg daily for depression/anxiety.  Encouraged patient to attend groups and participate in group counseling sessions and activities.  Discharge plan in progress.  Continue current treatment plan.   Address health issues: Vitals reviewed and stable.   Medical Decision Making Problem Points:  Established problem, stable/improving (1) and Review of psycho-social  stressors (1) Data Points:  Review of medication regiment & side effects (2)  I certify that inpatient services furnished can reasonably be expected to improve the patient's condition.  Fransisca Kaufmann NP-C 05/12/2013, 10:39 AM

## 2013-05-12 NOTE — [facility] Group Notes (Signed)
[facility] LCSW Group Therapy  05/12/2013 1:15 pm  Type of Therapy: Process Group Therapy  Participation Level:  Active  Participation Quality:  Appropriate  Affect:  Flat  Cognitive:  Oriented  Insight: Limited  Engagement in Group:  Limited  Engagement in Therapy:  Limited  Modes of Intervention:  Activity, Clarification, Education, Problem-solving and Support  Summary of Progress/Problems: Today's group addressed the issue of overcoming obstacles.  Patients were asked to identify their biggest obstacle post d/c that stands in the way of their on-going success, and then problem solve as to how to manage this.  Abad stated his biggest obstacle is getting his bills paid on time because he does not know when he is leaving the hospital.  Talked about both his storage unit and his belongings that he left on the property of an eye care center on [address], and how his sleeping bag and winter boots are worth a lot of money.  After validating about how conscientious he is about his belongings and paying bills on time, encouraged him to talk to Dr tomorrow so he can get clarity on d/c date.  [name] B 05/12/2013   3:29 PM
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
## 2013-05-12 NOTE — Progress Notes (Signed)
Seen and agreed. Delshawn Stech, MD 

## 2013-05-12 NOTE — Tx Team (Signed)
  Interdisciplinary Treatment Plan Update   Date Reviewed:  05/12/2013  Time Reviewed:  8:19 AM  Progress in Treatment:   Attending groups: Yes Participating in groups: Yes Taking medication as prescribed: Yes  Tolerating medication: Yes Family/Significant other contact made: No Patient understands diagnosis: Yes  Discussing patient identified problems/goals with staff: Yes Medical problems stabilized or resolved: Yes Denies suicidal/homicidal ideation: Yes Patient has not harmed self or others: Yes  For review of initial/current patient goals, please see plan of care.  Estimated Length of Stay:  4-5 days  Reason for Continuation of Hospitalization: Delusions  Hallucinations Medication stabilization  New Problems/Goals identified:  N/A  Discharge Plan or Barriers:   return home, follow up outpt  Additional Comments:  Continues to present with bizarre and delusional thoughts.  "Emogene Morgan is here in [place], and are looking for the next Mickey."  Has been inconsistent with his willingness to take medication.  Continue Zyprexa.  Attendees:  Signature: Thedore Mins, MD 05/12/2013 8:19 AM   Signature: Richelle Ito, LCSW 05/12/2013 8:19 AM  Signature: Fransisca Kaufmann, NP 05/12/2013 8:19 AM  Signature: Joslyn Devon, RN 05/12/2013 8:19 AM  Signature: Liborio Nixon, RN 05/12/2013 8:19 AM  Signature:  05/12/2013 8:19 AM  Signature:   05/12/2013 8:19 AM  Signature:    Signature:    Signature:    Signature:    Signature:    Signature:      Scribe for Treatment Team:   Richelle Ito, LCSW  05/12/2013 8:19 AM

## 2013-05-12 NOTE — Care Management Utilization Note (Signed)
   Per State Regulation 482.30  This chart was reviewed for necessity with respect to the patient's Admission/ Duration of stay.  Next review date: 05/15/13  Jenny Omdahl Morrison RN, BSN 

## 2013-05-13 MED ORDER — OLANZAPINE 7.5 MG PO TABS
15.0000 mg | ORAL_TABLET | Freq: Every day | ORAL | Status: DC
  Filled 2013-05-13: qty 2
  Filled 2013-05-13 (×2): qty 6

## 2013-05-13 NOTE — [facility] Suicide Risk Assessment (Signed)
[facility] INPATIENT:  Family/Significant Other Suicide Prevention Education  Suicide Prevention Education:  Education Completed; No one  has been identified by the patient as the family member/significant other with whom the patient will be residing, and identified as the person(s) who will aid the patient in the event of a mental health crisis (suicidal ideations/suicide attempt).  With written consent from the patient, the family member/significant other has been provided the following suicide prevention education, prior to the and/or following the discharge of the patient.  The suicide prevention education provided includes the following:  Suicide risk factors  Suicide prevention and interventions  National Suicide Hotline telephone number  [facility] assessment telephone number  [facility] Emergency Assistance 911  [facility] and/or Residential Mobile Crisis Unit telephone number  Request made of family/significant other to:  Remove weapons (e.g., guns, rifles, knives), all items previously/currently identified as safety concern.    Remove drugs/medications (over-the-counter, prescriptions, illicit drugs), all items previously/currently identified as a safety concern.  The family member/significant other verbalizes understanding of the suicide prevention education information provided.  The family member/significant other agrees to remove the items of safety concern listed above.  The patient did not endorse SI at the time of admission, nor did the patient c/o SI during the stay here.  SPE not required.   Albert Anderson 05/13/2013, 5:12 PM

## 2013-05-13 NOTE — Progress Notes (Signed)
D: Pt presents anxious. Pt continues to examine his pills and med labels prior to taking meds. Pt told writer this morning that he is going to live at the [facility] center because the lady told him he could stay there. Writer informed pt that he is not allowed on Eye Care property according to [facility] the Production designer, theatre/television/film. Pt is aware that he will be arrested for trespassing if he goes back to [facility] center. Pt has poor insight and is fixated on returning back there. Pt denies SI/HI/AVH. A: Medications administered as ordered per MD. Verbal support given. Pt encouraged to attend groups. 15 minute checks performed for safety. R: Pt safety maintained.

## 2013-05-13 NOTE — Progress Notes (Signed)
D   Pt was in his room in bed this evening   He was reluctant to talk and answer questions and did not want any medications for sleep   He denies suicidal and homicidal ideation and denies voices A  Verbal support given  Medications administered and effectiveness monitored   Q 15 min checks R   Pt safe at present

## 2013-05-13 NOTE — Consult Note (Signed)
Note reviewed and agreed with  

## 2013-05-13 NOTE — Progress Notes (Signed)
D   Pt was in bed when this writer approached him   He did not want to talk and answered in short statements   He isolated most of the shift although he was observed sitting in the floor just outside the nurses station  He makes poor to little eye contact   A   Verbal support given  Medications offered and discussed    Q 15 min checks R   Pt safe at present

## 2013-05-13 NOTE — Progress Notes (Signed)
Raynelle Fanning from [facility] called to speak to pt. Pt consent to talk to [place]. Raynelle Fanning asked pt if he needed a sleeping bag since his sleeping bag may be damage. Pt stated that he have renters insurance and can use his insurance to get another bag. Pt then stated  he have a credit for 2,000 dollars so he have money. Raynelle Fanning asked pt when will he be released from facility. Pt told Raynelle Fanning that he will be released tomorrow. Raynelle Fanning then asked pt if he have a place to stay. Pt told Raynelle Fanning that he will be camping out because he have a lot of camping equipment in storage. Per pt, he can't afford a room because he won't be able to pay for his storage room.

## 2013-05-13 NOTE — [facility] Group Notes (Signed)
[facility] LCSW Group Therapy  05/13/2013 , 12:32 PM   Type of Therapy:  Group Therapy  Participation Level:  Active  Participation Quality:  Attentive  Affect:  Appropriate  Cognitive:  Alert  Insight:  Improving  Engagement in Therapy:  Engaged  Modes of Intervention:  Discussion, Exploration and Socialization  Summary of Progress/Problems: Today's group focused on the term Diagnosis.  Participants were asked to define the term, and then pronounce whether it is a negative, positive or neutral term.  Albert Anderson rejected the whole concept of mental health diagnosis for himself, saying he was experiencing no problems before he came in,  And that it was all just a misunderstanding with the police when he was picked up.  Not only that, but he went on to say that all medication does is make him drowsy, and he has no intention of taking it when he is discharged.  When others were talking about relapse, he said the definition for himself is when he sleeps in instead of getting up and working out.  Albert Anderson 05/13/2013 , 12:32 PM

## 2013-05-13 NOTE — Progress Notes (Signed)
Patient ID: Albert Anderson, male   DOB: 03-06-1981, 32 y.o.   MRN: [id number] [facility] MD Progress Note  05/13/2013 11:34 AM Albert Anderson  MRN:  [id number] Subjective: " I think I am about ready to be discharged." Objective:  Patient's thought process remains bizarre and disorganized but he has been acting less delusional. He denies suicidal thoughts, depression and psychotic symptoms. He is compliant with his medications and has not endorsed any adverse reaction. He has been fixated on locating his personal belonging that he kept in the storage. He is requesting to be discharged soon so that he can take care of his personal business. Diagnosis:  DSM5: Schizophrenia Disorders: Schizophrenia (295.7)  Obsessive-Compulsive Disorders:  Trauma-Stressor Disorders: Posttraumatic Stress Disorder (309.81)  Substance/Addictive Disorders:  Depressive Disorders:  AXIS I: Undifferentiated schizophrenia  AXIS II: Deferred  AXIS III:  Past Medical History   Diagnosis  Date   .  Sickle cell trait    .  Bipolar 1 disorder    .  Schizophrenia     AXIS IV: economic problems, housing problems, occupational problems, other psychosocial or environmental problems and problems with primary support group  AXIS V: GAF 50-60   ADL's:  Intact  Sleep: Good  Appetite:  Good  Suicidal Ideation:  Denies Homicidal Ideation:  Denies AEB (as evidenced by):  Psychiatric Specialty Exam: Review of Systems  Constitutional: Negative.   HENT: Negative.   Eyes: Negative.   Respiratory: Negative.   Cardiovascular: Negative.   Gastrointestinal: Negative.   Genitourinary: Negative.   Musculoskeletal: Negative.   Skin: Negative.   Neurological: Negative.   Endo/Heme/Allergies: Negative.   Psychiatric/Behavioral: Negative for suicidal ideas, memory loss and substance abuse. The patient is nervous/anxious. The patient does not have insomnia.     Blood pressure 124/77, pulse 87, temperature 98 F (36.7 C),  temperature source Oral, resp. rate 20, height 5\' 5"  (1.651 m), weight 72.576 kg (160 lb).Body mass index is 26.63 kg/(m^2).  General Appearance: Casual  Eye Contact::  Fair  Speech:  Clear and Coherent  Volume:  Normal  Mood:  Dysphoric  Affect:  Blunt  Thought Process:  circumstancial  Orientation:  Full (Time, Place, and Person)  Thought Content:  Delusions  Suicidal Thoughts:  No  Homicidal Thoughts:  No  Memory:  Immediate;   Fair Recent;   Fair Remote;   Fair  Judgement:  marginal  Insight:  marginal  Psychomotor Activity:  Normal  Concentration:  Fair  Recall:  Fair  Akathisia:  No  Handed:  Right  AIMS (if indicated):     Assets:  Communication Skills Desire for Improvement Leisure Time Physical Health Resilience  Sleep:  Number of Hours: 6.25   Current Medications: Current Facility-Administered Medications  Medication Dose Route Frequency Provider Last Rate Last Dose  . acetaminophen (TYLENOL) tablet 650 mg  650 mg Oral Q6H PRN Shuvon Rankin, NP      . alum & mag hydroxide-simeth (MAALOX/MYLANTA) 200-200-20 MG/5ML suspension 30 mL  30 mL Oral Q4H PRN Shuvon Rankin, NP      . citalopram (CELEXA) tablet 10 mg  10 mg Oral Daily Eberardo Demello   10 mg at 05/13/13 0735  . magnesium hydroxide (MILK OF MAGNESIA) suspension 30 mL  30 mL Oral Daily PRN Shuvon Rankin, NP      . [START ON 05/14/2013] OLANZapine (ZYPREXA) tablet 15 mg  15 mg Oral QHS Shawnise Peterkin      . OLANZapine zydis (ZYPREXA) disintegrating tablet 10 mg  10 mg  Oral Q8H PRN Lasandra Batley   10 mg at 05/11/13 0900  . traZODone (DESYREL) tablet 50 mg  50 mg Oral QHS PRN Fransisca Kaufmann, NP        Lab Results: No results found for this or any previous visit (from the past 48 hour(s)).  Physical Findings: AIMS: Facial and Oral Movements Muscles of Facial Expression: None, normal Lips and Perioral Area: None, normal Jaw: None, normal Tongue: None, normal,Extremity Movements Upper (arms, wrists, hands,  fingers): None, normal Lower (legs, knees, ankles, toes): None, normal, Trunk Movements Neck, shoulders, hips: None, normal, Overall Severity Severity of abnormal movements (highest score from questions above): None, normal Incapacitation due to abnormal movements: None, normal Patient's awareness of abnormal movements (rate only patient's report): No Awareness, Dental Status Current problems with teeth and/or dentures?: No Does patient usually wear dentures?: No  CIWA:    COWS:     Treatment Plan Summary: Daily contact with patient to assess and evaluate symptoms and progress in treatment Medication management  Plan: Continue crisis management and stabilization.  Medication management: Reviewed with patient who stated no untoward effects. Continue Zyprexa 15 mg daily for psychosis and Celexa 10 mg daily for depression/anxiety.  Encouraged patient to attend groups and participate in group counseling sessions and activities.  Discharge plan in progress.  Continue current treatment plan.   Address health issues: Vitals reviewed and stable.   Medical Decision Making Problem Points:  Established problem, stable/improving (1) and Review of psycho-social stressors (1) Data Points:  Review of medication regiment & side effects (2)  I certify that inpatient services furnished can reasonably be expected to improve the patient's condition.  Thedore Mins, MD 05/13/2013, 11:34 AM

## 2013-05-14 MED ORDER — TRAZODONE HCL 50 MG PO TABS: 50.0000 mg | ORAL_TABLET | Freq: Every evening | ORAL | Status: DC | PRN

## 2013-05-14 MED ORDER — CITALOPRAM HYDROBROMIDE 10 MG PO TABS: 10.0000 mg | ORAL_TABLET | Freq: Every day | ORAL | Status: DC

## 2013-05-14 MED ORDER — OLANZAPINE 15 MG PO TABS: 15.0000 mg | ORAL_TABLET | Freq: Every day | ORAL | Status: DC

## 2013-05-14 NOTE — Tx Team (Signed)
  Interdisciplinary Treatment Plan Update   Date Reviewed:  05/14/2013  Time Reviewed:  8:20 AM  Progress in Treatment:   Attending groups: Yes Participating in groups: Yes Taking medication as prescribed: Yes  Tolerating medication: Yes Family/Significant other contact made: Yes  Patient understands diagnosis: Yes  Discussing patient identified problems/goals with staff: Yes Medical problems stabilized or resolved: Yes Denies suicidal/homicidal ideation: Yes Patient has not harmed self or others: Yes  For review of initial/current patient goals, please see plan of care.  Estimated Length of Stay:  D/C today  Reason for Continuation of Hospitalization:   New Problems/Goals identified:  N/A  Discharge Plan or Barriers:   return to his tent/storage unit, follow up [facility]  Additional Comments:  Attendees:  Signature: Thedore Mins, MD 05/14/2013 8:20 AM   Signature: Richelle Ito, LCSW 05/14/2013 8:20 AM  Signature: Fransisca Kaufmann, NP 05/14/2013 8:20 AM  Signature: Joslyn Devon, RN 05/14/2013 8:20 AM  Signature: Liborio Nixon, RN 05/14/2013 8:20 AM  Signature:  05/14/2013 8:20 AM  Signature:   05/14/2013 8:20 AM  Signature:    Signature:    Signature:    Signature:    Signature:    Signature:      Scribe for Treatment Team:   Richelle Ito, LCSW  05/14/2013 8:20 AM

## 2013-05-14 NOTE — Progress Notes (Signed)
Adult Psychoeducational Group Note  Date:  05/14/2013 Time:  12:49 PM  Group Topic/Focus:  Dimensions of Wellness:   The focus of this group is to introduce the topic of wellness and discuss the role each dimension of wellness plays in total health.  Participation Level:  Minimal  Participation Quality:  Appropriate  Affect:  Flat  Cognitive:  Appropriate  Insight: Appropriate  Engagement in Group:  Limited  Modes of Intervention:  Education  Additional Comments: Subject did not offer a goal during group and they did participate in the 5 senses activity about trying to redirect oneself's when feeling anxious. Patient stated one could look at the ceiling tiles to refocus. Patient was quiet during group.   Albert Anderson 05/14/2013, 12:49 PM

## 2013-05-14 NOTE — [facility] Suicide Risk Assessment (Signed)
Suicide Risk Assessment  Discharge Assessment     Demographic Factors:  Male, Low socioeconomic status, Unemployed and African American  Mental Status Per Nursing Assessment::   On Admission:  NA  Current Mental Status by Physician: patient denies suicidal ideation, intent or plan  Loss Factors: Financial problems/change in socioeconomic status  Historical Factors: Impulsivity  Risk Reduction Factors:   draws disability check  Continued Clinical Symptoms:  Schizophrenia:   Paranoid or undifferentiated type  Cognitive Features That Contribute To Risk:  Closed-mindedness Polarized thinking    Suicide Risk:  Minimal: No identifiable suicidal ideation.  Patients presenting with no risk factors but with morbid ruminations; may be classified as minimal risk based on the severity of the depressive symptoms  Discharge Diagnoses:   AXIS I:  Undifferentiated schizophrenia  AXIS II:  Deferred AXIS III:   Past Medical History  Diagnosis Date  . Sickle cell trait    AXIS IV:  housing problems, other psychosocial or environmental problems and problems related to social environment AXIS V:  61-70 mild symptoms  Plan Of Care/Follow-up recommendations:  Activity:  as tolerated Diet:  healthy Tests:  routine  Other:  patient to keep his after care appointment  Is patient on multiple antipsychotic therapies at discharge:  No   Has Patient had three or more failed trials of antipsychotic monotherapy by history:  No  Recommended Plan for Multiple Antipsychotic Therapies: NA  Thedore Mins, MD 05/14/2013, 10:13 AM

## 2013-05-14 NOTE — Progress Notes (Signed)
Discharge note: Pt received both written and verbal discharge instructions. Pt agreed pt f/u appt and med regimen. Pt received sample meds, prescriptions, bus pass, donated shoes, and all belongings. Pt denies SI/HI/AVH at time of discharge. Pt safely left [facility].

## 2013-05-14 NOTE — Progress Notes (Signed)
[facility] Adult Case Management Discharge Plan :  Will you be returning to the same living situation after discharge: Yes,  near storage unit At discharge, do you have transportation home?:Yes,  bus Do you have the ability to pay for your medications:Yes,  [facility]  Release of information consent forms completed and in the chart;  Patient's signature needed at discharge.  Patient to Follow up at: Follow-up Information   Follow up with [facility]. (Go to the walk-in clinic M-F between 8 and 9AM for your hospital follow-up appointment)    Contact information:   [address]  [place]  [phone]      Patient denies SI/HI:   Yes,  yes    Safety Planning and Suicide Prevention discussed:  Yes,  yes  Ida Rogue 05/14/2013, 8:23 AM
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
## 2013-05-15 NOTE — Discharge Summary (Signed)
Seen and agreed. Albany Winslow, MD 

## 2013-05-19 NOTE — Progress Notes (Signed)
Patient Discharge Instructions:  After Visit Summary (AVS):   Faxed to:  05/19/13 Discharge Summary Note:   Faxed to:  05/19/13 Psychiatric Admission Assessment Note:   Faxed to:  05/19/13 Suicide Risk Assessment - Discharge Assessment:   Faxed to:  05/19/13 Faxed/Sent to the Next Level Care provider:  05/19/13 Faxed to [facility] @ [phone]  Jerelene Redden, 05/19/2013, 2:40 PM

## 2013-07-03 ENCOUNTER — Emergency Department (HOSPITAL_COMMUNITY)
Admission: EM | Admit: 2013-07-03 | Discharge: 2013-07-04 | Disposition: A | Discharge status: Home / Self Care | Payer: Medicare Other | Attending: Emergency Medicine | Admitting: Emergency Medicine

## 2013-07-03 ENCOUNTER — Encounter (HOSPITAL_COMMUNITY): Payer: Self-pay | Admitting: Emergency Medicine

## 2013-07-03 DIAGNOSIS — K644 Residual hemorrhoidal skin tags: Secondary | ICD-10-CM | POA: Insufficient documentation

## 2013-07-03 DIAGNOSIS — Z8659 Personal history of other mental and behavioral disorders: Secondary | ICD-10-CM | POA: Insufficient documentation

## 2013-07-03 DIAGNOSIS — Z862 Personal history of diseases of the blood and blood-forming organs and certain disorders involving the immune mechanism: Secondary | ICD-10-CM | POA: Insufficient documentation
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
## 2013-07-04 NOTE — ED Notes (Signed)
Patient sitting in chair, playing/textig on cell phone Patient states that he does not want to put on hospital gown or get in bed Patient in NAD Awaiting provider

## 2013-07-04 NOTE — Discharge Instructions (Signed)
Hemorrhoids Hemorrhoids are swollen veins around the rectum or anus. There are two types of hemorrhoids:   Internal hemorrhoids. These occur in the veins just inside the rectum. They may poke through to the outside and become irritated and painful.  External hemorrhoids. These occur in the veins outside the anus and can be felt as a painful swelling or hard lump near the anus. CAUSES  Pregnancy.   Obesity.   Constipation or diarrhea.   Straining to have a bowel movement.   Sitting for long periods on the toilet.  Heavy lifting or other activity that caused you to strain.  Anal intercourse. SYMPTOMS   Pain.   Anal itching or irritation.   Rectal bleeding.   Fecal leakage.   Anal swelling.   One or more lumps around the anus.  DIAGNOSIS  Your caregiver may be able to diagnose hemorrhoids by visual examination. Other examinations or tests that may be performed include:   Examination of the rectal area with a gloved hand (digital rectal exam).   Examination of anal canal using a small tube (scope).   A blood test if you have lost a significant amount of blood.  A test to look inside the colon (sigmoidoscopy or colonoscopy). TREATMENT Most hemorrhoids can be treated at home. However, if symptoms do not seem to be getting better or if you have a lot of rectal bleeding, your caregiver may perform a procedure to help make the hemorrhoids get smaller or remove them completely. Possible treatments include:   Placing a rubber band at the base of the hemorrhoid to cut off the circulation (rubber band ligation).   Injecting a chemical to shrink the hemorrhoid (sclerotherapy).   Using a tool to burn the hemorrhoid (infrared light therapy).   Surgically removing the hemorrhoid (hemorrhoidectomy).   Stapling the hemorrhoid to block blood flow to the tissue (hemorrhoid stapling).  HOME CARE INSTRUCTIONS   Eat foods with fiber, such as whole grains, beans,  nuts, fruits, and vegetables. Ask your doctor about taking products with added fiber in them (fibersupplements).  Increase fluid intake. Drink enough water and fluids to keep your urine clear or pale yellow.   Exercise regularly.   Go to the bathroom when you have the urge to have a bowel movement. Do not wait.   Avoid straining to have bowel movements.   Keep the anal area dry and clean. Use wet toilet paper or moist towelettes after a bowel movement.   Medicated creams and suppositories may be used or applied as directed.   Only take over-the-counter or prescription medicines as directed by your caregiver.   Take warm sitz baths for 15 20 minutes, 3 4 times a day to ease pain and discomfort.   Place ice packs on the hemorrhoids if they are tender and swollen. Using ice packs between sitz baths may be helpful.   Put ice in a plastic bag.   Place a towel between your skin and the bag.   Leave the ice on for 15 20 minutes, 3 4 times a day.   Do not use a donut-shaped pillow or sit on the toilet for long periods. This increases blood pooling and pain.  SEEK MEDICAL CARE IF:  You have increasing pain and swelling that is not controlled by treatment or medicine.  You have uncontrolled bleeding.  You have difficulty or you are unable to have a bowel movement.  You have pain or inflammation outside the area of the hemorrhoids. MAKE SURE YOU:    Understand these instructions.  Will watch your condition.  Will get help right away if you are not doing well or get worse. Document Released: 05/26/2000 Document Revised: 05/15/2012 Document Reviewed: 04/02/2012 [employer] Patient Information 2014 [employer].  

## 2013-07-04 NOTE — ED Provider Notes (Signed)
CSN: [id number]     Arrival date & time 07/03/13  2153 History   First MD Initiated Contact with Patient 07/04/13 0109     Chief Complaint  Patient presents with  . Multiple Complaints    (Consider location/radiation/quality/duration/timing/severity/associated sxs/prior Treatment) HPI Patient states he noted a "bump" around his rectum this evening. He is concerned that it may be cancer. Is not painful. He's had no abdominal pain. He's had no nausea vomiting or diarrhea. Patient does strain to have bowel movements. No blood noted in stool. He has no fevers or chills. Patient has no personal family history of cancer. Past Medical History  Diagnosis Date  . Sickle cell trait   . Bipolar 1 disorder   . Schizophrenia    History reviewed. No pertinent past surgical history. History reviewed. No pertinent family history. History  Substance Use Topics  . Smoking status: Never Smoker   . Smokeless tobacco: Not on file  . Alcohol Use: No    Review of Systems  Constitutional: Negative for fever and chills.  Gastrointestinal: Negative for nausea, vomiting, abdominal pain, diarrhea and blood in stool.  Skin: Negative for rash.  All other systems reviewed and are negative.    Allergies  Review of patient's allergies indicates no known allergies.  Home Medications  No current outpatient prescriptions on file. BP 123/62  Pulse 98  Temp(Src) 98.4 F (36.9 C) (Oral)  Resp 15  Ht 5\' 6"  (1.676 m)  Wt 150 lb (68.04 kg)  BMI 24.22 kg/m2  SpO2 97% Physical Exam  Nursing note and vitals reviewed. Constitutional: He is oriented to person, place, and time. He appears well-developed and well-nourished. No distress.  HENT:  Head: Normocephalic and atraumatic.  Mouth/Throat: Oropharynx is clear and moist.  Eyes: EOM are normal. Pupils are equal, round, and reactive to light.  Neck: Normal range of motion. Neck supple.  Cardiovascular: Normal rate and regular rhythm.   Pulmonary/Chest:  Effort normal and breath sounds normal.  Abdominal: Soft. Bowel sounds are normal. He exhibits no distension and no mass. There is no tenderness. There is no rebound and no guarding.  Genitourinary:  External hemorrhoid noted at the anal verge. There is no evidence of thrombosis.  Musculoskeletal: Normal range of motion. He exhibits no edema and no tenderness.  Neurological: He is alert and oriented to person, place, and time.  5/5 motor in all extremities. Sensation grossly intact.  Skin: Skin is warm and dry. No rash noted. No erythema.  Psychiatric: He has a normal mood and affect. His behavior is normal.    ED Course  Procedures (including critical care time) Labs Review Labs Reviewed - No data to display Imaging Review No results found.  EKG Interpretation   None       MDM   1. External hemorrhoid    Patient reassured and given handouts on hemorrhoid care and prevention. Return precautions given.    Loren Raceravid Rayan Dyal, MD 07/04/13 [id number]

## 2013-07-04 NOTE — ED Notes (Signed)
Patient did not want VS taken at time of discharge Patient appears in NAD upon leaving ED

## 2013-09-08 ENCOUNTER — Encounter (HOSPITAL_COMMUNITY): Payer: Self-pay | Admitting: Emergency Medicine

## 2013-09-08 ENCOUNTER — Emergency Department (HOSPITAL_COMMUNITY): Payer: Medicare Other

## 2013-09-08 ENCOUNTER — Emergency Department (HOSPITAL_COMMUNITY)
Admission: EM | Admit: 2013-09-08 | Discharge: 2013-09-08 | Disposition: A | Discharge status: Home / Self Care | Payer: Medicare Other | Attending: Emergency Medicine | Admitting: Emergency Medicine

## 2013-09-08 DIAGNOSIS — F29 Unspecified psychosis not due to a substance or known physiological condition: Secondary | ICD-10-CM | POA: Insufficient documentation

## 2013-09-08 DIAGNOSIS — Y92410 Unspecified street and highway as the place of occurrence of the external cause: Secondary | ICD-10-CM | POA: Insufficient documentation

## 2013-09-08 DIAGNOSIS — Y9389 Activity, other specified: Secondary | ICD-10-CM | POA: Insufficient documentation

## 2013-09-08 DIAGNOSIS — S61209A Unspecified open wound of unspecified finger without damage to nail, initial encounter: Secondary | ICD-10-CM | POA: Diagnosis not present

## 2013-09-08 DIAGNOSIS — D573 Sickle-cell trait: Secondary | ICD-10-CM | POA: Diagnosis not present

## 2013-09-08 DIAGNOSIS — S0003XA Contusion of scalp, initial encounter: Secondary | ICD-10-CM | POA: Diagnosis not present

## 2013-09-08 DIAGNOSIS — H5789 Other specified disorders of eye and adnexa: Secondary | ICD-10-CM | POA: Insufficient documentation

## 2013-09-08 DIAGNOSIS — S1093XA Contusion of unspecified part of neck, initial encounter: Principal | ICD-10-CM

## 2013-09-08 DIAGNOSIS — Z8659 Personal history of other mental and behavioral disorders: Secondary | ICD-10-CM | POA: Insufficient documentation

## 2013-09-08 DIAGNOSIS — IMO0002 Reserved for concepts with insufficient information to code with codable children: Secondary | ICD-10-CM | POA: Insufficient documentation

## 2013-09-08 DIAGNOSIS — S0990XA Unspecified injury of head, initial encounter: Secondary | ICD-10-CM | POA: Diagnosis present

## 2013-09-08 DIAGNOSIS — S0083XA Contusion of other part of head, initial encounter: Principal | ICD-10-CM

## 2013-09-08 LAB — PROTIME-INR
INR: 1.05 (ref 0.00–1.49)
Prothrombin Time: 13.5 seconds (ref 11.6–15.2)

## 2013-09-08 LAB — COMPREHENSIVE METABOLIC PANEL
ALK PHOS: 105 U/L (ref 39–117)
ALT: 34 U/L (ref 0–53)
AST: 45 U/L — AB (ref 0–37)
Albumin: 4.3 g/dL (ref 3.5–5.2)
BUN: 17 mg/dL (ref 6–23)
CALCIUM: 9.5 mg/dL (ref 8.4–10.5)
CO2: 22 mEq/L (ref 19–32)
Chloride: 100 mEq/L (ref 96–112)
Creatinine, Ser: 1.09 mg/dL (ref 0.50–1.35)
GFR calc non Af Amer: 89 mL/min — ABNORMAL LOW (ref >90)
Glucose, Bld: 150 mg/dL — ABNORMAL HIGH (ref 70–99)
Potassium: 3.6 mEq/L — ABNORMAL LOW (ref 3.7–5.3)
Sodium: 139 mEq/L (ref 137–147)
Total Bilirubin: 1.7 mg/dL — ABNORMAL HIGH (ref 0.3–1.2)
Total Protein: 7.4 g/dL (ref 6.0–8.3)

## 2013-09-08 LAB — CBC
HCT: 35.5 % — ABNORMAL LOW (ref 39.0–52.0)
HEMOGLOBIN: 13.5 g/dL (ref 13.0–17.0)
MCH: 28.4 pg (ref 26.0–34.0)
MCHC: 37.9 g/dL — AB (ref 30.0–36.0)
MCV: 75.2 fL — ABNORMAL LOW (ref 78.0–100.0)
Platelets: 257 10*3/uL (ref 150–400)
RBC: 4.72 MIL/uL (ref 4.22–5.81)
RDW: 16.4 % — AB (ref 11.5–15.5)
WBC: 13.8 10*3/uL — ABNORMAL HIGH (ref 4.0–10.5)

## 2013-09-08 LAB — SAMPLE TO BLOOD BANK

## 2013-09-08 LAB — ETHANOL: Alcohol, Ethyl (B): <11 mg/dL (ref 0–11)

## 2013-09-08 MED ORDER — SODIUM CHLORIDE 0.9 % IV SOLN: 1000.0000 mL | INTRAVENOUS | Status: DC

## 2013-09-08 MED ORDER — SODIUM CHLORIDE 0.9 % IV SOLN
1000.0000 mL | Freq: Once | INTRAVENOUS | Status: AC
  Administered 2013-09-08: 1000 mL via INTRAVENOUS
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
## 2013-09-08 NOTE — ED Notes (Signed)
Pt continues to speak to people that that are not there with clear speech; pt denies psychiatric hx
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
## 2013-09-08 NOTE — ED Notes (Signed)
Vital signs stable.; pt transported to CT
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
## 2013-09-09 NOTE — ED Provider Notes (Signed)
This patient was seen in conjunction with the resident physician, Dr. Marcha SoldersBrtalik.  The documentation accurately reflects the patient's ED course.  On my exam the patient was uncomfortable appearing, but awake, alert, speaking, though tangentially, nonsensically. Patient's evaluation does not demonstrate acute injuries, though he did not multiple cutaneous lesions. Patient improved considerably while in the ED, he was awake, alert, listening to music, interacting wildly.  I reviewed the images and agree with the interpretation.   Albert Munchobert [name], MD 09/09/13 [phone]

## 2016-11-01 NOTE — Congregational Nurse Program (Signed)
Congregational Nurse Program Note  Date of Encounter: 11/01/2016  Past Medical History: Past Medical History:  Diagnosis Date  . Bipolar 1 disorder   . Schizophrenia   . Sickle cell trait     Encounter Details:     CNP Questionnaire - 11/01/16 1532      Patient Demographics   Is this a new or existing patient? New   Patient is considered a/an Not Applicable   Race African-American/Black     Patient Assistance   Location of Patient Assistance Not Applicable   Patient's financial/insurance status Low Income;[employer]   Uninsured Patient ([employer], trade [employer]Card/Care Connects) Yes   Interventions Not Applicable   Patient referred to apply for the following financial assistance Not Applicable   Food insecurities addressed Not Applicable   Transportation assistance No   Assistance securing medications No   Educational health offerings Not Applicable     Encounter Details   Primary purpose of visit Acute Illness/Condition Visit   Was an Emergency Department visit averted? Not Applicable   Does patient have a medical provider? Yes   Patient referred to Not Applicable   Was a mental health screening completed? (GAINS tool) No   Does patient have dental issues? No   Does patient have vision issues? No   Does your patient have an abnormal blood pressure today? No   Since previous encounter, have you referred patient for abnormal blood pressure that resulted in a new diagnosis or medication change? No   Does your patient have an abnormal blood glucose today? No   Since previous encounter, have you referred patient for abnormal blood glucose that resulted in a new diagnosis or medication change? No   Was there a life-saving intervention made? No     states has a "bump" on the back of his head.  Noted a small area (less than 5 mm) with a scab.  Area is slightly raised with no redness.  Suggested client wash his hair and apply friction to his scalp.

## 2017-12-03 ENCOUNTER — Other Ambulatory Visit: Payer: Self-pay

## 2017-12-03 ENCOUNTER — Encounter (HOSPITAL_COMMUNITY): Payer: Self-pay | Admitting: *Deleted

## 2017-12-03 ENCOUNTER — Emergency Department (HOSPITAL_COMMUNITY)
Admission: EM | Admit: 2017-12-03 | Discharge: 2017-12-03 | Disposition: A | Discharge status: Home / Self Care | Payer: Medicare Other | Attending: Emergency Medicine | Admitting: Emergency Medicine

## 2017-12-03 DIAGNOSIS — W228XXA Striking against or struck by other objects, initial encounter: Secondary | ICD-10-CM | POA: Insufficient documentation

## 2017-12-03 DIAGNOSIS — Y929 Unspecified place or not applicable: Secondary | ICD-10-CM | POA: Insufficient documentation

## 2017-12-03 DIAGNOSIS — S01112A Laceration without foreign body of left eyelid and periocular area, initial encounter: Secondary | ICD-10-CM | POA: Insufficient documentation

## 2017-12-03 DIAGNOSIS — Y998 Other external cause status: Secondary | ICD-10-CM | POA: Insufficient documentation

## 2017-12-03 DIAGNOSIS — D573 Sickle-cell trait: Secondary | ICD-10-CM | POA: Insufficient documentation

## 2017-12-03 DIAGNOSIS — Y939 Activity, unspecified: Secondary | ICD-10-CM | POA: Insufficient documentation

## 2017-12-03 DIAGNOSIS — S0181XA Laceration without foreign body of other part of head, initial encounter: Secondary | ICD-10-CM

## 2017-12-03 MED ORDER — LIDOCAINE-EPINEPHRINE (PF) 2 %-1:200000 IJ SOLN
10.0000 mL | Freq: Once | INTRAMUSCULAR | Status: DC
  Filled 2017-12-03: qty 20

## 2017-12-03 NOTE — ED Provider Notes (Signed)
[facility] EMERGENCY DEPARTMENT Provider Note   CSN: [id number] Arrival date & time: 12/03/17  1919     History   Chief Complaint Chief Complaint  Patient presents with  . Laceration    HPI Albert Anderson is a 37 y.o. male.  Albert Anderson is a 37 y.o. Male with a history of bipolar disorder and schizophrenia, who presents to the emergency department for evaluation of facial laceration.  Patient is refusing to speak but will write answers on a small sheet of paper, when asked why he is not speaking he responded by writing "do not feel like it".  Patient reports he ran into a door and hit his left eyebrow causing a small laceration he denies loss of consciousness, no headache, vision changes, dizziness, nausea or vomiting.  Patient denies any numbness weakness or tingling in his extremities.  His tetanus is up-to-date.  He is not on any blood thinners.  Denies any neck pain or tenderness.  No other injuries.     Past Medical History:  Diagnosis Date  . Bipolar 1 disorder (HCC)   . Schizophrenia (HCC)   . Sickle cell trait [facility])     Patient Active Problem List   Diagnosis Date Noted  . Posttraumatic stress disorder 05/08/2013  . 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 No pertinent surgical history.      Home Medications    Prior to Admission medications   Not on File    Family History No family history on file.  Social History Social History   Tobacco Use  . Smoking status: Never Smoker  . Smokeless tobacco: Never Used  Substance Use Topics  . Alcohol use: No  . Drug use: No     Allergies   Patient has no known allergies.   Review of Systems Review of Systems  Constitutional: Negative for chills and fever.  HENT: Positive for facial swelling.   Eyes: Negative for visual disturbance.  Gastrointestinal: Negative for nausea and vomiting.  Musculoskeletal:  Negative for neck pain and neck stiffness.  Skin: Positive for wound.  Neurological: Negative for dizziness, weakness, light-headedness, numbness and headaches.     Physical Exam Updated Vital Signs BP 120/70 (BP Location: Right Arm)   Pulse 74   Temp 98.4 F (36.9 C) (Oral)   Resp 16   SpO2 97%   Physical Exam  Constitutional: He is oriented to person, place, and time. He appears well-developed and well-nourished. No distress.  HENT:  Head: Normocephalic and atraumatic.  1 cm stellate laceration at the end of the left eyebrow, bleeding controlled, no underlying bony tenderness, no other appreciable hematomas, step-off or deformity of the scalp, no hemotympanum or CSF otorrhea  Eyes: Right eye exhibits no discharge. Left eye exhibits no discharge.  Neck: Normal range of motion. Neck supple.  No midline C-spine tenderness  Pulmonary/Chest: Effort normal. No respiratory distress.  Neurological: He is alert and oriented to person, place, and time. Coordination normal.  Pt unwilling to speak but nods yes or no and able to follow commands CN III-XII intact Normal strength in upper and lower extremities bilaterally including dorsiflexion and plantar flexion, strong and equal grip strength Sensation normal to light and sharp touch Moves extremities without ataxia, coordination intact Normal finger to nose and rapid alternating movements No pronator drift  Skin: Skin is warm. He is not diaphoretic.  Psychiatric: He has a normal mood and affect. His behavior is normal.  Nursing note and vitals reviewed.    ED Treatments / Results  Labs (all labs ordered are listed, but only abnormal results are displayed) Labs Reviewed - No data to display  EKG None  Radiology No results found.  Procedures .Marland Kitchen.Laceration Repair Date/Time: 12/03/2017 11:45 PM Performed by: Dartha LodgeFord, Kelsey N, PA-C Authorized by: Dartha LodgeFord, Kelsey N, PA-C   Consent:    Consent obtained:  Verbal   Risks discussed:   Infection, pain, poor cosmetic result and poor wound healing   Alternatives discussed:  No treatment Anesthesia (see MAR for exact dosages):    Anesthesia method:  Local infiltration   Local anesthetic:  Lidocaine 2% WITH epi Laceration details:    Location:  Face   Face location:  L eyebrow   Length (cm):  1 (Stellate) Repair type:    Repair type:  Simple Pre-procedure details:    Preparation:  Patient was prepped and draped in usual sterile fashion Exploration:    Hemostasis achieved with:  Epinephrine and direct pressure   Wound exploration: entire depth of wound probed and visualized     Wound extent: areolar tissue violated   Treatment:    Area cleansed with:  Saline   Amount of cleaning:  Standard   Irrigation solution:  Sterile saline   Irrigation method:  Syringe Skin repair:    Repair method:  Sutures   Suture size:  5-0   Wound skin closure material used: vicryl rapide.   Number of sutures:  4 Approximation:    Approximation:  Close Post-procedure details:    Dressing:  Antibiotic ointment and adhesive bandage   Patient tolerance of procedure:  Tolerated well, no immediate complications   (including critical care time)  Medications Ordered in ED Medications  lidocaine-EPINEPHrine (XYLOCAINE W/EPI) 2 %-1:200000 (PF) injection 10 mL (has no administration in time range)     Initial Impression / Assessment and Plan / ED Course  I have reviewed the triage vital signs and the nursing notes.  Pertinent labs & imaging results that were available during my care of the patient were reviewed by me and considered in my medical decision making (see chart for details).  Patient presents to the ED for evaluation of laceration to the left eyebrow after running into a door, no LOC or concerning red flag symptoms, normal neurologic exam.  Pressure irrigation performed. Wound explored and base of wound visualized in a bloodless field without evidence of foreign body.   Laceration occurred < 8 hours prior to repair which was well tolerated.  Chart review reveals tetanus is already up-to-date. Pt discharged  without antibiotics.  Discussed suture home care with patient and answered questions.  Absorbable sutures in place so patient will not have to return for removal; they are to return to the ED sooner for signs of infection. Pt is hemodynamically stable with no complaints prior to dc.    Final Clinical Impressions(s) / ED Diagnoses   Final diagnoses:  Facial laceration, initial encounter    ED Discharge Orders    None       Legrand RamsFord, Kelsey N, PA-C 12/04/17 0117    Jacalyn LefevreHaviland, Julie, MD 12/06/17 1159

## 2017-12-03 NOTE — ED Notes (Signed)
Pt not talking.   

## 2017-12-03 NOTE — Discharge Instructions (Signed)
You have absorbable sutures in place which will dissolve in about 10 days, keep area clean and dry and covered with a bandage, you may shower but do not submerge the head underwater.  Monitor the area for signs of infection such as redness, swelling, pain or drainage, if these occur return to the emergency department.

## 2017-12-03 NOTE — ED Triage Notes (Signed)
Pt not speaking and denies hearing impairment.  Writes answers and states ran into door, no LOC and has small lac to lateral left eye.  No blood thinners

## 2018-07-26 ENCOUNTER — Encounter (HOSPITAL_COMMUNITY): Payer: Self-pay

## 2018-07-26 ENCOUNTER — Emergency Department (HOSPITAL_COMMUNITY): Payer: Self-pay

## 2018-07-26 ENCOUNTER — Emergency Department (HOSPITAL_COMMUNITY)
Admission: EM | Admit: 2018-07-26 | Discharge: 2018-07-26 | Disposition: A | Discharge status: Home / Self Care | Payer: Self-pay | Attending: Emergency Medicine | Admitting: Emergency Medicine

## 2018-07-26 DIAGNOSIS — R072 Precordial pain: Secondary | ICD-10-CM | POA: Insufficient documentation

## 2018-07-26 DIAGNOSIS — D573 Sickle-cell trait: Secondary | ICD-10-CM | POA: Insufficient documentation

## 2018-07-26 DIAGNOSIS — F319 Bipolar disorder, unspecified: Secondary | ICD-10-CM | POA: Insufficient documentation

## 2018-07-26 DIAGNOSIS — F209 Schizophrenia, unspecified: Secondary | ICD-10-CM | POA: Insufficient documentation

## 2018-07-26 LAB — CBC WITH DIFFERENTIAL/PLATELET
ABS IMMATURE GRANULOCYTES: 0.02 10*3/uL (ref 0.00–0.07)
Basophils Absolute: 0.1 10*3/uL (ref 0.0–0.1)
Basophils Relative: 1 %
Eosinophils Absolute: 0.1 10*3/uL (ref 0.0–0.5)
Eosinophils Relative: 2 %
HCT: 32.9 % — ABNORMAL LOW (ref 39.0–52.0)
HEMOGLOBIN: 11.5 g/dL — AB (ref 13.0–17.0)
IMMATURE GRANULOCYTES: 0 %
LYMPHS PCT: 34 %
Lymphs Abs: 2.4 10*3/uL (ref 0.7–4.0)
MCH: 26.9 pg (ref 26.0–34.0)
MCHC: 35 g/dL (ref 30.0–36.0)
MCV: 77 fL — AB (ref 80.0–100.0)
MONO ABS: 0.9 10*3/uL (ref 0.1–1.0)
MONOS PCT: 13 %
NEUTROS ABS: 3.6 10*3/uL (ref 1.7–7.7)
NEUTROS PCT: 50 %
PLATELETS: 314 10*3/uL (ref 150–400)
RBC: 4.27 MIL/uL (ref 4.22–5.81)
RDW: 15.9 % — ABNORMAL HIGH (ref 11.5–15.5)
WBC: 7.1 10*3/uL (ref 4.0–10.5)
nRBC: 1 % — ABNORMAL HIGH (ref 0.0–0.2)

## 2018-07-26 LAB — COMPREHENSIVE METABOLIC PANEL
ALK PHOS: 71 U/L (ref 38–126)
ALT: 17 U/L (ref 0–44)
AST: 20 U/L (ref 15–41)
Albumin: 3.8 g/dL (ref 3.5–5.0)
Anion gap: 10 (ref 5–15)
BILIRUBIN TOTAL: 1.4 mg/dL — AB (ref 0.3–1.2)
BUN: 8 mg/dL (ref 6–20)
CALCIUM: 9 mg/dL (ref 8.9–10.3)
CO2: 23 mmol/L (ref 22–32)
CREATININE: 1.13 mg/dL (ref 0.61–1.24)
Chloride: 107 mmol/L (ref 98–111)
GFR calc Af Amer: >60 mL/min (ref >60)
GFR calc non Af Amer: >60 mL/min (ref >60)
GLUCOSE: 87 mg/dL (ref 70–99)
Potassium: 3.6 mmol/L (ref 3.5–5.1)
SODIUM: 140 mmol/L (ref 135–145)
Total Protein: 6.2 g/dL — ABNORMAL LOW (ref 6.5–8.1)

## 2018-07-26 LAB — TROPONIN I: Troponin I: <0.03 ng/mL (ref <0.03)

## 2018-07-26 LAB — LIPASE, BLOOD: Lipase: 27 U/L (ref 11–51)

## 2018-07-26 LAB — D-DIMER, QUANTITATIVE: D-Dimer, Quant: <0.27 ug/mL-FEU (ref 0.00–0.50)

## 2018-07-26 NOTE — ED Triage Notes (Signed)
Pt comes via [facility], pt is mute,began having CP today with SOB, worse on the R side, felt like there is fluid in his lungs, denies fevers, denies nausea, RBBB, and brady, hx of sickle cell. PTA received 324 ASA

## 2018-07-26 NOTE — ED Provider Notes (Signed)
[facility] EMERGENCY DEPARTMENT Provider Note   CSN: [id number] Arrival date & time: 07/26/18  0407     History   Chief Complaint Chief Complaint  Patient presents with  . Chest Pain   Level 5 caveat as patient is nonverbal  HPI Albert Anderson is a 38 y.o. male.  The history is provided by the patient and the EMS personnel.  Chest Pain  Pain location:  R chest Pain quality: aching   Pain radiates to:  Does not radiate Pain severity:  Moderate Onset quality:  Sudden Timing:  Constant Progression:  Worsening Chronicity:  New Associated symptoms: shortness of breath   She presents via EMS for chest pain.  He reportedly woke up with chest pain and felt short of breath.  He reported fluid in his lungs.  No fevers reported.  Patient is mute and only communicates via writing. No other details known at this time  Past Medical History:  Diagnosis Date  . Bipolar 1 disorder (HCC)   . Schizophrenia (HCC)   . Sickle cell trait [facility])     Patient Active Problem List   Diagnosis Date Noted  . Posttraumatic stress disorder 05/08/2013  . 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 No pertinent surgical history.      Home Medications    Prior to Admission medications   Not on File    Family History No family history on file.  Social History Social History   Tobacco Use  . Smoking status: Never Smoker  . Smokeless tobacco: Never Used  Substance Use Topics  . Alcohol use: No  . Drug use: No     Allergies   Patient has no known allergies.   Review of Systems Review of Systems  Unable to perform ROS: Patient nonverbal  Respiratory: Positive for shortness of breath.   Cardiovascular: Positive for chest pain.     Physical Exam Updated Vital Signs BP 113/64   Pulse (!) 53   Temp 98.2 F (36.8 C)   Resp 15   SpO2 100%   Physical Exam CONSTITUTIONAL: Well  developed/well nourished HEAD: Normocephalic/atraumatic EYES: EOMI/PERRL ENMT: Mucous membranes moist, patient is wearing a facemask when he takes it off no signs of facial trauma, uvula midline without erythema and no stridor NECK: supple no meningeal signs SPINE/BACK:entire spine nontender CV: S1/S2 noted, no murmurs/rubs/gallops noted LUNGS: Lungs are clear to auscultation bilaterally, no apparent distress ABDOMEN: soft, nontender, no rebound or guarding, bowel sounds noted throughout abdomen GU:no cva tenderness NEURO: Pt is awake/alert/appropriate, moves all extremitiesx4.  No facial droop.   EXTREMITIES: pulses normal/equal, full ROM, no lower extremity edema or tenderness SKIN: warm, color normal    ED Treatments / Results  Labs (all labs ordered are listed, but only abnormal results are displayed) Labs Reviewed  CBC WITH DIFFERENTIAL/PLATELET - Abnormal; Notable for the following components:      Result Value   Hemoglobin 11.5 (*)    HCT 32.9 (*)    MCV 77.0 (*)    RDW 15.9 (*)    nRBC 1.0 (*)    All other components within normal limits  COMPREHENSIVE METABOLIC PANEL - Abnormal; Notable for the following components:   Total Protein 6.2 (*)    Total Bilirubin 1.4 (*)    All other components within normal limits  LIPASE, BLOOD  TROPONIN I  D-DIMER, QUANTITATIVE (NOT AT [facility])    EKG EKG Interpretation  Date/Time:  Friday  July 26 2018 04:15:51 EST Ventricular Rate:  54 PR Interval:    QRS Duration: 138 QT Interval:  436 QTC Calculation: 414 R Axis:   122 Text Interpretation:  Sinus rhythm Atrial premature complex Short PR interval Nonspecific intraventricular conduction delay Abnormal ekg Confirmed by Zadie Rhine ([id number]) on 07/26/2018 4:24:39 AM   EKG Interpretation  Date/Time:  Friday July 26 2018 07:05:13 EST Ventricular Rate:  53 PR Interval:  102 QRS Duration: 146 QT Interval:  442 QTC Calculation: 414 R Axis:   90 Text Interpretation:   Sinus bradycardia with short PR Right bundle branch block Abnormal ECG Confirmed by Zadie Rhine ([id number]) on 07/26/2018 7:14:26 AM Also confirmed by Zadie Rhine ([id number]), editor Barbette Hair [phone])  on 07/26/2018 7:17:04 AM        Radiology Dg Chest 2 View  Result Date: 07/26/2018 CLINICAL DATA:  Chest pain with shortness of breath, worse on the right EXAM: CHEST - 2 VIEW COMPARISON:  09/08/2013 FINDINGS: Normal heart size and mediastinal contours. No acute infiltrate or edema. No effusion or pneumothorax. No acute osseous findings. IMPRESSION: Negative chest. Electronically Signed   By: Marnee Spring M.D.   On: 07/26/2018 05:03    Procedures Procedures   Medications Ordered in ED Medications - No data to display   Initial Impression / Assessment and Plan / ED Course  I have reviewed the triage vital signs and the nursing notes.  Pertinent labs & imaging results that were available during my care of the patient were reviewed by me and considered in my medical decision making (see chart for details).     5:35 AM Patient presented for chest pain.  It is reported that he is mute and only communicates via writing.  However he has been seen previously and has been verbal previously.  He also said he had sickle cell, but further investigation was a sickle cell trait.  His vital signs are appropriate.  His EKG is abnormal though no recent EKG to compare.  No STEMI is noted 7:22 AM Patient reports feeling improved.  He continues to be nonverbal but will shake his head during questioning.  He also points to previous written words for answers.  He took several deep breaths and reports he had no pain.  It appears that his pain was right-sided and was mostly pleuritic.  He had a negative d-dimer, no hypoxia, negative chest x-ray. He is currently chest pain-free. Feel he is appropriate for discharge Final Clinical Impressions(s) / ED Diagnoses   Final diagnoses:  Precordial pain     ED Discharge Orders    None       Zadie Rhine, MD 07/26/18 [phone]

## 2018-07-26 NOTE — Discharge Instructions (Signed)

## 2019-01-03 ENCOUNTER — Other Ambulatory Visit: Payer: Self-pay

## 2019-01-03 DIAGNOSIS — Z20822 Contact with and (suspected) exposure to covid-19: Secondary | ICD-10-CM

## 2019-01-07 LAB — NOVEL CORONAVIRUS, NAA: SARS-CoV-2, NAA: NOT DETECTED

## 2019-03-07 ENCOUNTER — Other Ambulatory Visit: Payer: Self-pay

## 2019-03-07 DIAGNOSIS — Z20822 Contact with and (suspected) exposure to covid-19: Secondary | ICD-10-CM

## 2019-03-08 LAB — NOVEL CORONAVIRUS, NAA: SARS-CoV-2, NAA: NOT DETECTED

## 2019-05-29 ENCOUNTER — Other Ambulatory Visit: Payer: Self-pay | Admitting: *Deleted

## 2019-05-29 DIAGNOSIS — Z20822 Contact with and (suspected) exposure to covid-19: Secondary | ICD-10-CM

## 2019-05-31 LAB — NOVEL CORONAVIRUS, NAA: SARS-CoV-2, NAA: NOT DETECTED

## 2019-11-22 ENCOUNTER — Encounter (HOSPITAL_COMMUNITY): Payer: Self-pay

## 2019-11-22 ENCOUNTER — Emergency Department (HOSPITAL_COMMUNITY)
Admission: EM | Admit: 2019-11-22 | Discharge: 2019-11-22 | Disposition: A | Discharge status: Home / Self Care | Payer: Self-pay | Attending: Emergency Medicine | Admitting: Emergency Medicine

## 2019-11-22 DIAGNOSIS — R21 Rash and other nonspecific skin eruption: Secondary | ICD-10-CM | POA: Insufficient documentation

## 2019-11-22 MED ORDER — PREDNISONE 20 MG PO TABS: 60.0000 mg | ORAL_TABLET | Freq: Every day | ORAL | 0 refills | Status: AC

## 2019-11-22 MED ORDER — DIPHENHYDRAMINE HCL 25 MG PO TABS: 25.0000 mg | ORAL_TABLET | Freq: Four times a day (QID) | ORAL | 0 refills | Status: AC | PRN

## 2019-11-22 MED ORDER — PREDNISONE 20 MG PO TABS
60.0000 mg | ORAL_TABLET | Freq: Once | ORAL | Status: AC
  Administered 2019-11-22: 60 mg via ORAL
  Filled 2019-11-22: qty 3

## 2019-11-22 MED ORDER — FAMOTIDINE 20 MG PO TABS: 20.0000 mg | ORAL_TABLET | Freq: Two times a day (BID) | ORAL | 0 refills | Status: AC

## 2019-11-22 MED ORDER — FAMOTIDINE 20 MG PO TABS
20.0000 mg | ORAL_TABLET | Freq: Once | ORAL | Status: AC
  Administered 2019-11-22: 20 mg via ORAL
  Filled 2019-11-22: qty 1

## 2019-11-22 MED ORDER — DIPHENHYDRAMINE HCL 25 MG PO CAPS
25.0000 mg | ORAL_CAPSULE | Freq: Once | ORAL | Status: AC
  Administered 2019-11-22: 25 mg via ORAL
  Filled 2019-11-22: qty 1

## 2019-11-22 NOTE — Discharge Instructions (Addendum)
Take 5 days of steroids, Pepcid and Benadryl to treat rash, if rash is still not resolving you should follow-up with your primary care provider.  You were given a dose of prednisone today, take first dose of this medication tomorrow.  Avoid any new medications, lotions or skin products that could further irritate rash.  Avoid sun exposure while rash is improving.  Take your prescriptions to the [facility] on [address] with you match letter to receive medications.

## 2019-11-22 NOTE — ED Notes (Signed)
Patient verbalizes understanding of discharge instructions. Opportunity for questioning and answers were provided. Armband removed by staff, pt discharged from ED.  

## 2019-11-22 NOTE — ED Provider Notes (Signed)
[facility] EMERGENCY DEPARTMENT Provider Note   CSN: [id number] Arrival date & time: 11/22/19  1209     History Chief Complaint  Patient presents with  . Urticaria    Albert Anderson is a 39 y.o. male.  Albert Anderson is a 39 y.o. male with a history of sickle cell trait, bipolar disorder, schizophrenia, who presents to the emergency department for evaluation of rash.  Patient reports pruritic rash to bilateral upper extremities that has been present for a few days.  He reports rash is pruritic, not painful.  He has not noticed any drainage from the rash.  The rash has spread up his arms and his shirt sleeve stop.  He reports he noticed rash showed up shortly after he had crawled through a bunch and thinks this may have caused it.  He denies history of similar in the past denies any known allergies or history of anaphylaxis.  No associated facial swelling, difficulty breathing or swallowing.  No lightheadedness or syncope, no nausea or vomiting.  No fevers or chills.  No known insect bites.  No meds prior to arrival.  No other aggravating or alleviating factors.  At baseline patient is nonverbal but is able to write out responses to questions to communicate history.        Past Medical History:  Diagnosis Date  . Bipolar 1 disorder ([place])   . Schizophrenia ([place])   . Sickle cell trait [facility])     Patient Active Problem List   Diagnosis Date Noted  . Posttraumatic stress disorder 05/08/2013  . Undifferentiated schizophrenia ([place]) 05/08/2013  . Psychotic disorder ([place]) 05/07/2013    History reviewed. No pertinent surgical history.     No family history on file.  Social History   Tobacco Use  . Smoking status: Never Smoker  . Smokeless tobacco: Never Used  Substance Use Topics  . Alcohol use: No  . Drug use: No    Home Medications Prior to Admission medications   Medication Sig Start Date End Date Taking? Authorizing Provider  diphenhydrAMINE  (BENADRYL) 25 MG tablet Take 1 tablet (25 mg total) by mouth every 6 (six) hours as needed. 11/22/19   Jacqlyn Larsen, PA-C  famotidine (PEPCID) 20 MG tablet Take 1 tablet (20 mg total) by mouth 2 (two) times daily. 11/22/19   Jacqlyn Larsen, PA-C  predniSONE (DELTASONE) 20 MG tablet Take 3 tablets (60 mg total) by mouth daily for 5 days. 11/22/19 11/27/19  Jacqlyn Larsen, PA-C    Allergies    Patient has no known allergies.  Review of Systems   Review of Systems  Constitutional: Negative for chills and fever.  HENT: Negative for facial swelling.   Respiratory: Negative for shortness of breath.   Gastrointestinal: Negative for nausea and vomiting.  Skin: Positive for rash.    Physical Exam Updated Vital Signs BP 117/65   Pulse 64   Temp 99 F (37.2 C) (Oral)   Resp 14   SpO2 100%   Physical Exam Vitals and nursing note reviewed.  Constitutional:      General: He is not in acute distress.    Appearance: He is well-developed. He is not diaphoretic.  HENT:     Head: Normocephalic and atraumatic.  Eyes:     General:        Right eye: No discharge.        Left eye: No discharge.  Pulmonary:     Effort: Pulmonary effort is normal. No respiratory  distress.  Skin:    General: Skin is warm and dry.     Findings: Rash present.     Comments: Erythematous urticarial rash with some smaller erythematous elevated areas to bilateral upper extremities with no vesicles, pustules or petechiae, no skin sloughing  Neurological:     Mental Status: He is alert and oriented to person, place, and time.     Coordination: Coordination normal.  Psychiatric:        Behavior: Behavior normal.     ED Results / Procedures / Treatments   Labs (all labs ordered are listed, but only abnormal results are displayed) Labs Reviewed - No data to display  EKG None  Radiology No results found.  Procedures Procedures (including critical care time)  Medications Ordered in ED Medications    diphenhydrAMINE (BENADRYL) capsule 25 mg (25 mg Oral Given 11/22/19 1345)  famotidine (PEPCID) tablet 20 mg (20 mg Oral Given 11/22/19 1345)  predniSONE (DELTASONE) tablet 60 mg (60 mg Oral Given 11/22/19 1345)    ED Course  I have reviewed the triage vital signs and the nursing notes.  Pertinent labs & imaging results that were available during my care of the patient were reviewed by me and considered in my medical decision making (see chart for details).    MDM Rules/Calculators/A&P                          Rash consistent with Urticaria. Patient denies any difficulty breathing or swallowing.  Pt has a patent airway without stridor and is handling secretions without difficulty; no angioedema. No blisters, no pustules, no warmth, no draining sinus tracts, no superficial abscesses, no bullous impetigo, no vesicles, no desquamation, no target lesions with dusky purpura or a central bulla. Not tender to touch. No concern for superimposed infection. No concern for SJS, TEN, TSS, tick borne illness, syphilis or other life-threatening condition. Will discharge home with short course of steroids, pepcid and recommend Benadryl as needed for pruritis.  Patient without money to afford prescriptions, Case management and social work consulted and they provided patient with an actual letter to get medications from the pharmacy.  Patient discharged in good condition.   Final Clinical Impression(s) / ED Diagnoses Final diagnoses:  Rash    Rx / DC Orders ED Discharge Orders         Ordered    predniSONE (DELTASONE) 20 MG tablet  Daily     Discontinue  Reprint     11/22/19 1509    famotidine (PEPCID) 20 MG tablet  2 times daily     Discontinue  Reprint     11/22/19 1509    diphenhydrAMINE (BENADRYL) 25 MG tablet  Every 6 hours PRN     Discontinue  Reprint     11/22/19 1509           Dartha Lodge, PA-C 11/25/19 2143    Mancel Bale, MD 11/26/19 1559

## 2019-11-22 NOTE — ED Notes (Signed)
Called CM to follow up on consult.

## 2019-11-22 NOTE — ED Notes (Signed)
Pt assistance letter, Rx's and d/c instructions reviewed with pt

## 2019-11-22 NOTE — ED Triage Notes (Addendum)
Pt bib [facility] for eval BUE hives. Pt nonverbal.

## 2020-07-17 ENCOUNTER — Other Ambulatory Visit: Payer: Self-pay

## 2020-07-17 ENCOUNTER — Encounter (HOSPITAL_COMMUNITY): Payer: Self-pay | Admitting: Emergency Medicine

## 2020-07-17 ENCOUNTER — Emergency Department (HOSPITAL_COMMUNITY)
Admission: EM | Admit: 2020-07-17 | Discharge: 2020-07-17 | Disposition: A | Discharge status: Home / Self Care | Payer: Self-pay | Attending: Emergency Medicine | Admitting: Emergency Medicine

## 2020-07-17 DIAGNOSIS — T50901A Poisoning by unspecified drugs, medicaments and biological substances, accidental (unintentional), initial encounter: Secondary | ICD-10-CM | POA: Insufficient documentation

## 2020-07-17 DIAGNOSIS — T6591XA Toxic effect of unspecified substance, accidental (unintentional), initial encounter: Secondary | ICD-10-CM

## 2020-07-17 LAB — CBC WITH DIFFERENTIAL/PLATELET
Abs Immature Granulocytes: 0.03 10*3/uL (ref 0.00–0.07)
Basophils Absolute: 0.1 10*3/uL (ref 0.0–0.1)
Basophils Relative: 1 %
Eosinophils Absolute: 0.1 10*3/uL (ref 0.0–0.5)
Eosinophils Relative: 1 %
HCT: 32.2 % — ABNORMAL LOW (ref 39.0–52.0)
Hemoglobin: 11.8 g/dL — ABNORMAL LOW (ref 13.0–17.0)
Immature Granulocytes: 0 %
Lymphocytes Relative: 13 %
Lymphs Abs: 1.7 10*3/uL (ref 0.7–4.0)
MCH: 28.5 pg (ref 26.0–34.0)
MCHC: 36.6 g/dL — ABNORMAL HIGH (ref 30.0–36.0)
MCV: 77.8 fL — ABNORMAL LOW (ref 80.0–100.0)
Monocytes Absolute: 1.3 10*3/uL — ABNORMAL HIGH (ref 0.1–1.0)
Monocytes Relative: 10 %
Neutro Abs: 10.1 10*3/uL — ABNORMAL HIGH (ref 1.7–7.7)
Neutrophils Relative %: 75 %
Platelets: 282 10*3/uL (ref 150–400)
RBC: 4.14 MIL/uL — ABNORMAL LOW (ref 4.22–5.81)
RDW: 15.9 % — ABNORMAL HIGH (ref 11.5–15.5)
WBC: 13.3 10*3/uL — ABNORMAL HIGH (ref 4.0–10.5)
nRBC: 0.3 % — ABNORMAL HIGH (ref 0.0–0.2)

## 2020-07-17 LAB — RAPID URINE DRUG SCREEN, HOSP PERFORMED
Amphetamines: NOT DETECTED
Barbiturates: NOT DETECTED
Benzodiazepines: NOT DETECTED
Cocaine: NOT DETECTED
Opiates: NOT DETECTED
Tetrahydrocannabinol: NOT DETECTED

## 2020-07-17 LAB — ETHANOL: Alcohol, Ethyl (B): <10 mg/dL (ref <10)

## 2020-07-17 NOTE — ED Provider Notes (Signed)
[facility]-EMERGENCY DEPT Provider Note   CSN: [id number] Arrival date & time: 07/17/20  1448     History Chief Complaint  Patient presents with  . Ingestion    Albert Anderson is a 40 y.o. male.  40 year old male with history of bipolar disorder, schizophrenia, sickle cell trait presents with complaint of feeling unwell after drinking juice at the [facility] today. Patient requests testing of the juice to see if she has been poisoned. Patient communicated by writing on paper. No other complaints or concerns.         Past Medical History:  Diagnosis Date  . Bipolar 1 disorder (HCC)   . Schizophrenia (HCC)   . Sickle cell trait [facility])     Patient Active Problem List   Diagnosis Date Noted  . Posttraumatic stress disorder 05/08/2013  . 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 No pertinent surgical history.     No family history on file.  Social History   Tobacco Use  . Smoking status: Never Smoker  . Smokeless tobacco: Never Used  Substance Use Topics  . Alcohol use: No  . Drug use: No    Home Medications Prior to Admission medications   Medication Sig Start Date End Date Taking? Authorizing Provider  diphenhydrAMINE (BENADRYL) 25 MG tablet Take 1 tablet (25 mg total) by mouth every 6 (six) hours as needed. 11/22/19   Dartha Lodge, PA-C  famotidine (PEPCID) 20 MG tablet Take 1 tablet (20 mg total) by mouth 2 (two) times daily. 11/22/19   Dartha Lodge, PA-C    Allergies    Patient has no known allergies.  Review of Systems   Review of Systems  Constitutional: Negative for chills and fever.  Respiratory: Negative for shortness of breath.   Cardiovascular: Negative for chest pain.  Gastrointestinal: Negative for abdominal pain, nausea and vomiting.  Musculoskeletal: Negative for arthralgias and myalgias.  Skin: Negative for rash.  Allergic/Immunologic: Negative for  immunocompromised state.  Neurological: Negative for dizziness, weakness and headaches.  Psychiatric/Behavioral: Negative for confusion.  All other systems reviewed and are negative.   Physical Exam Updated Vital Signs BP 132/75 (BP Location: Right Arm)   Pulse 74   Temp 98.9 F (37.2 C)   Resp 18   SpO2 98%   Physical Exam Vitals and nursing note reviewed.  Constitutional:      General: He is not in acute distress.    Appearance: He is well-developed and well-nourished. He is not diaphoretic.  HENT:     Head: Normocephalic and atraumatic.  Eyes:     Conjunctiva/sclera: Conjunctivae normal.  Cardiovascular:     Rate and Rhythm: Normal rate and regular rhythm.     Pulses: Normal pulses.     Heart sounds: Normal heart sounds.  Pulmonary:     Effort: Pulmonary effort is normal.     Breath sounds: Normal breath sounds.  Abdominal:     Palpations: Abdomen is soft.     Tenderness: There is no abdominal tenderness.  Skin:    General: Skin is warm and dry.     Findings: No erythema or rash.  Neurological:     Mental Status: He is alert and oriented to person, place, and time.  Psychiatric:        Mood and Affect: Mood and affect normal.        Behavior: Behavior normal.     ED Results / Procedures /  Treatments   Labs (all labs ordered are listed, but only abnormal results are displayed) Labs Reviewed  CBC WITH DIFFERENTIAL/PLATELET - Abnormal; Notable for the following components:      Result Value   WBC 13.3 (*)    RBC 4.14 (*)    Hemoglobin 11.8 (*)    HCT 32.2 (*)    MCV 77.8 (*)    MCHC 36.6 (*)    RDW 15.9 (*)    nRBC 0.3 (*)    Neutro Abs 10.1 (*)    Monocytes Absolute 1.3 (*)    All other components within normal limits  RAPID URINE DRUG SCREEN, HOSP PERFORMED  ETHANOL    EKG None  Radiology No results found.  Procedures Procedures   Medications Ordered in ED Medications - No data to display  ED Course  I have reviewed the triage vital  signs and the nursing notes.  Pertinent labs & imaging results that were available during my care of the patient were reviewed by me and considered in my medical decision making (see chart for details).  Clinical Course as of 07/17/20 1741  Sat Jul 17, 2020  [id number] 40 year old male with concern for ingestion/poisoning, requesting testing of juice sample she has brought with her.  Discussed limited testing in the ER setting, able to test urine for limited illicit drugs, blood for alcohol, patient is agreeable with this. UDS and Etoh negative. CBC without significant changes from prior. Advised to follow up with PCP. Can consider police report.  [LM]    Clinical Course User Index [LM] Alden Hipp   MDM Rules/Calculators/A&P                          Final Clinical Impression(s) / ED Diagnoses Final diagnoses:  Ingestion of substance, accidental or unintentional, initial encounter    Rx / DC Orders ED Discharge Orders    None       Jeannie Fend, PA-C 07/17/20 1742    Charlynne Pander, MD 07/17/20 2223

## 2020-07-17 NOTE — Discharge Instructions (Addendum)
Your lab work today is reassuring. Urine drug screen is negative (does not show ingestion of testable drugs). Your alcohol is also negative. Recommend follow up with your doctor. Can consider follow up with police if needed/concerned.

## 2020-07-17 NOTE — ED Triage Notes (Signed)
Per EMS, patient from [facility], reports "feeling off" after drinking juice from a carton. Patient is nonverbal, writes information on paper. Patient is homeless.

## 2020-09-03 ENCOUNTER — Encounter: Payer: Self-pay | Admitting: *Deleted

## 2020-09-03 NOTE — Congregational Nurse Program (Signed)
  Dept: [phone]   Congregational Nurse Program Note  Date of Encounter: 09/03/2020  Past Medical History: Past Medical History:  Diagnosis Date  . Bipolar 1 disorder (HCC)   . Schizophrenia (HCC)   . Sickle cell trait (HCC)     Encounter Details:  CNP Questionnaire - 09/03/20 1013      Questionnaire   Do you give verbal consent to treat you today? Yes    Visit Setting Church or Organization    Location Patient Served At [facility]    Patient Status Homeless    Medical Provider Yes    Insurance Uninsured (Includes [employer] [place])    Housing/Utilities No permanent housing          Client was seen at [facility] following ED visit. Client was non verbal and wrote on paper for communication. Per client he was in the ED for drinking juice. Difficult to understand if the juice made him sick. Client has poor eye contact and reports he needs no help at this time. Client does not express si or hi.  Jan W RN CN [phone]

## 2021-03-01 ENCOUNTER — Encounter (HOSPITAL_COMMUNITY): Payer: Self-pay | Admitting: Emergency Medicine

## 2021-03-01 ENCOUNTER — Emergency Department (HOSPITAL_COMMUNITY): Payer: Self-pay

## 2021-03-01 ENCOUNTER — Other Ambulatory Visit: Payer: Self-pay

## 2021-03-01 ENCOUNTER — Emergency Department (HOSPITAL_COMMUNITY)
Admission: EM | Admit: 2021-03-01 | Discharge: 2021-03-01 | Disposition: A | Discharge status: Home / Self Care | Payer: Self-pay | Attending: Emergency Medicine | Admitting: Emergency Medicine

## 2021-03-01 DIAGNOSIS — T23029A Burn of unspecified degree of unspecified single finger (nail) except thumb, initial encounter: Secondary | ICD-10-CM

## 2021-03-01 DIAGNOSIS — S61431A Puncture wound without foreign body of right hand, initial encounter: Secondary | ICD-10-CM | POA: Insufficient documentation

## 2021-03-01 DIAGNOSIS — Y929 Unspecified place or not applicable: Secondary | ICD-10-CM | POA: Insufficient documentation

## 2021-03-01 DIAGNOSIS — X58XXXA Exposure to other specified factors, initial encounter: Secondary | ICD-10-CM | POA: Insufficient documentation

## 2021-03-01 DIAGNOSIS — T23022A Burn of unspecified degree of single left finger (nail) except thumb, initial encounter: Secondary | ICD-10-CM | POA: Insufficient documentation

## 2021-03-01 MED ORDER — BACITRACIN ZINC 500 UNIT/GM EX OINT: 1.0000 "application " | TOPICAL_OINTMENT | Freq: Two times a day (BID) | CUTANEOUS | 0 refills | Status: AC

## 2021-03-01 MED ORDER — BACITRACIN ZINC 500 UNIT/GM EX OINT
TOPICAL_OINTMENT | Freq: Once | CUTANEOUS | Status: AC
  Administered 2021-03-01: 1 via TOPICAL
  Filled 2021-03-01: qty 0.9

## 2021-03-01 NOTE — ED Triage Notes (Signed)
Pt has a R hand laceration. No bleeding. Pt will not talk. Hx of schizophrenia. Alert.

## 2021-03-01 NOTE — Discharge Instructions (Signed)
1.  Keep your wounds clean and dry.  Do not pick or pull off the blister until it is coming off on its own.  Apply bacitracin ointment twice a day over the burned areas.  Keep a dressing over them when you are doing any activities. 2.  See your doctor for recheck in 2 to 4 days.

## 2021-03-01 NOTE — ED Provider Notes (Signed)
[facility]-EMERGENCY DEPT Provider Note   CSN: [id number] Arrival date & time: 03/01/21  1544     History Chief Complaint  Patient presents with   Extremity Laceration    Albert Anderson is a 40 y.o. male.  HPI Patient is alert.  He has schizophrenia.  He is not verbally interactive at this time but will nod and point.  He is not exhibiting any distress.  He is not agitated.  He points out a wound on the dorsum of the right hand.  It is healing.  He endorses that it is a burn.  He also has a blistered area on the right index finger which he endorses the burn.  There is a small amount of clear drainage from it.  Patient holds up 2 fingers saying it happened 2 days ago.  He does not specify what caused the burn.  No other apparent complaints.    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 No pertinent surgical history.     History reviewed. No pertinent family history.  Social History   Tobacco Use   Smoking status: Never   Smokeless tobacco: Never  Substance Use Topics   Alcohol use: No   Drug use: No    Home Medications Prior to Admission medications   Medication Sig Start Date End Date Taking? Authorizing Provider  diphenhydrAMINE (BENADRYL) 25 MG tablet Take 1 tablet (25 mg total) by mouth every 6 (six) hours as needed. 11/22/19   Dartha Lodge, PA-C  famotidine (PEPCID) 20 MG tablet Take 1 tablet (20 mg total) by mouth 2 (two) times daily. 11/22/19   Dartha Lodge, PA-C    Allergies    Patient has no known allergies.  Review of Systems   Review of Systems Level 5 caveat cannot obtain review of systems due to schizophrenia. Physical Exam Updated Vital  Signs BP 118/63 (BP Location: Left Arm)   Pulse 64   Temp 98.6 F (37 C) (Oral)   Resp (!) 59   SpO2 98%   Physical Exam Constitutional:      Comments: Patient is alert and nontoxic.  No distress.  Eyes:     Extraocular Movements: Extraocular movements intact.  Pulmonary:     Effort: Pulmonary effort is normal.  Musculoskeletal:     Comments: Patient has a small wound to the dorsum of the right hand that appears to be healing.  See attached images.  Also has a blistered right index finger with a small amount of serous drainage from a ruptured blister.  See attached images.  Range of motion intact.       ED Results / Procedures / Treatments   Labs (all labs ordered are listed, but only abnormal results are displayed) Labs Reviewed - No data to display  EKG None  Radiology DG Hand Complete Right  Result Date: 03/01/2021 CLINICAL DATA:  Swelling, wound and drainage at the base of the fourth finger. EXAM: RIGHT HAND - COMPLETE 3+ VIEW COMPARISON:  Right hand x-ray 09/08/2013. FINDINGS: There is no evidence of fracture or dislocation. There is no evidence of arthropathy or other focal bone abnormality. Soft tissues are unremarkable. IMPRESSION: Negative. Electronically Signed  By: Darliss Cheney M.D.   On: 03/01/2021 17:32    Procedures Procedures   Medications Ordered in ED Medications - No data to display  ED Course  I have reviewed the triage vital signs and the nursing notes.  Pertinent labs & imaging results that were available during my care of the patient were reviewed by me and considered in my medical decision making (see chart for details).    MDM Rules/Calculators/A&P                          Patient has a minor injury to the hand.  The dorsum is healing well.  Patient has some blister with serous drainage on the index finger.  This is not circumferential or showing any signs of infection or decreased range of motion.  At this time stable for topical treatment  and follow-up if needed.  Final Clinical Impression(s) / ED Diagnoses Final diagnoses:  Burn of digit of hand    Rx / DC Orders ED Discharge Orders     None        Arby Barrette, MD 03/05/21 [phone]

## 2021-03-01 NOTE — ED Provider Notes (Signed)
Emergency Medicine Provider Triage Evaluation Note  Albert Anderson , a 40 y.o. male  was evaluated in triage.  Pt complains of right hand wound.  Patient is apparently choosing not to speak today.  When I asked him if he is able to speak and simply choosing not to he shakes his head.  He is right-hand dominant.  He indicates that he burned his right hand.  .  Review of Systems  Positive: Hand pain, swelling Negative: fevers  Physical Exam  BP 118/63 (BP Location: Left Arm)   Pulse 64   Temp 98.6 F (37 C) (Oral)   Resp (!) 59   SpO2 98%  Gen:   Awake, no distress   Resp:  Normal effort  MSK:   Moves extremities without difficulty  Other:  Obvious edema of the right hand with superficial wounds and scant clear discharge.   Medical Decision Making  Medically screening exam initiated at 4:25 PM.  Appropriate orders placed.  Navraj Dreibelbis was informed that the remainder of the evaluation will be completed by another provider, this initial triage assessment does not replace that evaluation, and the importance of remaining in the ED until their evaluation is complete.  Note: Portions of this report may have been transcribed using voice recognition software. Every effort was made to ensure accuracy; however, inadvertent computerized transcription errors may be present    Cristina Gong, PA-C 03/01/21 1627    Arby Barrette, MD 03/05/21 [phone]

## 2021-06-13 ENCOUNTER — Other Ambulatory Visit: Payer: Self-pay

## 2021-06-13 ENCOUNTER — Emergency Department (HOSPITAL_COMMUNITY): Payer: Self-pay

## 2021-06-13 ENCOUNTER — Emergency Department (HOSPITAL_COMMUNITY)
Admission: EM | Admit: 2021-06-13 | Discharge: 2021-06-14 | Discharge status: Against Medical Advice | Payer: Self-pay | Attending: Emergency Medicine | Admitting: Emergency Medicine

## 2021-06-13 ENCOUNTER — Encounter (HOSPITAL_COMMUNITY): Payer: Self-pay | Admitting: Emergency Medicine

## 2021-06-13 DIAGNOSIS — X58XXXA Exposure to other specified factors, initial encounter: Secondary | ICD-10-CM | POA: Insufficient documentation

## 2021-06-13 DIAGNOSIS — S61212A Laceration without foreign body of right middle finger without damage to nail, initial encounter: Secondary | ICD-10-CM | POA: Insufficient documentation

## 2021-06-13 DIAGNOSIS — S61219A Laceration without foreign body of unspecified finger without damage to nail, initial encounter: Secondary | ICD-10-CM

## 2021-06-13 NOTE — ED Notes (Signed)
Pt called for vitals; no response.  

## 2021-06-13 NOTE — ED Provider Notes (Signed)
Emergency Medicine Provider Triage Evaluation Note  Albert Anderson , a 41 y.o. male  was evaluated in triage.  Pt complains of a laceration to the right third distal finger.  He says is something dropped on it.  No other injury.  Bleeding controlled prior to arrival.  Review of Systems  Positive:  Negative: See above   Physical Exam  BP 134/71    Pulse 86    Temp 99 F (37.2 C) (Oral)    Resp 18    SpO2 95%  Gen:   Awake, no distress   Resp:  Normal effort  MSK:   Moves extremities without difficulty  Other:  1 cm laceration to the anterior of the right third finger  Medical Decision Making  Medically screening exam initiated at 6:12 PM.  Appropriate orders placed.  Jayquan Bradsher was informed that the remainder of the evaluation will be completed by another provider, this initial triage assessment does not replace that evaluation, and the importance of remaining in the ED until their evaluation is complete.     Teressa Lower, PA-C 06/13/21 1817    Sloan Leiter, DO 06/14/21 0013

## 2021-06-13 NOTE — ED Triage Notes (Signed)
Patient here from home finger injury on right hand. Bleeding controlled in triage.

## 2021-06-14 ENCOUNTER — Encounter (HOSPITAL_COMMUNITY): Payer: Self-pay

## 2021-06-14 ENCOUNTER — Emergency Department (HOSPITAL_COMMUNITY)
Admission: EM | Admit: 2021-06-14 | Discharge: 2021-06-15 | Disposition: A | Discharge status: Home / Self Care | Payer: Self-pay | Attending: Emergency Medicine | Admitting: Emergency Medicine

## 2021-06-14 DIAGNOSIS — R509 Fever, unspecified: Secondary | ICD-10-CM | POA: Insufficient documentation

## 2021-06-14 DIAGNOSIS — W208XXA Other cause of strike by thrown, projected or falling object, initial encounter: Secondary | ICD-10-CM | POA: Insufficient documentation

## 2021-06-14 DIAGNOSIS — S61212A Laceration without foreign body of right middle finger without damage to nail, initial encounter: Secondary | ICD-10-CM | POA: Insufficient documentation

## 2021-06-14 DIAGNOSIS — Z20822 Contact with and (suspected) exposure to covid-19: Secondary | ICD-10-CM | POA: Insufficient documentation

## 2021-06-14 NOTE — ED Triage Notes (Signed)
Pt arrives POV from home for eval of R middle finger laceration, seen yesterday for same. Will not speak in triage, gestures that he is able to write. Hx of schizophrenia.

## 2021-06-14 NOTE — ED Provider Notes (Addendum)
[facility] EMERGENCY DEPARTMENT Provider Note   CSN: [id number] Arrival date & time: 06/14/21  1315     History  Chief Complaint  Patient presents with   Finger Injury    Albert Anderson is a 41 y.o. male with PMHx schizophrenia who presents to the ED today with complaint of finger injury to R middle finger that he sustained 2 days ago.  Per chart review patient was seen yesterday and reported that something dropped on it.  He had an x-ray done without any acute findings.  Does appear he left prior to being seen and returned tonight.  He not very cooperative during exam.  He is not in his voice to respond to my questions however answers with head nods.  When asked how many days ago this happened he uses his fingers to put up the #2.  He states that he does not typically use his voice.  Per chart review this does appear consistent with previous ED presentations.  Patient nods yes when asked if his tetanus is less than 37 years old.   The history is provided by the patient and medical records.      Home Medications Prior to Admission medications   Medication Sig Start Date End Date Taking? Authorizing Provider  cephALEXin (KEFLEX) 500 MG capsule Take 1 capsule (500 mg total) by mouth 2 (two) times daily for 5 days. 06/15/21 06/20/21 Yes Desjuan Stearns, PA-C  bacitracin ointment Apply 1 application topically 2 (two) times daily. 03/01/21   Arby Barrette, MD  diphenhydrAMINE (BENADRYL) 25 MG tablet Take 1 tablet (25 mg total) by mouth every 6 (six) hours as needed. 11/22/19   Dartha Lodge, PA-C  famotidine (PEPCID) 20 MG tablet Take 1 tablet (20 mg total) by mouth 2 (two) times daily. 11/22/19   Dartha Lodge, PA-C      Allergies    Patient has no known allergies.    Review of Systems   Review of Systems  Unable to perform ROS: Patient nonverbal  Constitutional:  Negative for fever.  Skin:  Positive for wound.   Physical Exam Updated Vital Signs BP (!) 103/55     Pulse (!) 50    Temp 97.7 F (36.5 C) (Oral)    Resp 16    Ht 5\' 6"  (1.676 m)    Wt 73 kg    SpO2 100%    BMI 25.98 kg/m  Physical Exam Vitals and nursing note reviewed.  Constitutional:      Appearance: He is not ill-appearing.  HENT:     Head: Normocephalic and atraumatic.  Eyes:     Conjunctiva/sclera: Conjunctivae normal.  Cardiovascular:     Rate and Rhythm: Normal rate and regular rhythm.  Pulmonary:     Effort: Pulmonary effort is normal.     Breath sounds: Normal breath sounds. No wheezing, rhonchi or rales.  Musculoskeletal:     Comments: Dried blood/healing laceration to distal pad of R middle finger. Cap refill < 2 seconds. ROM intact to MCP, PIP, and DIP joint of same finger. 2+ radial pulse.   Skin:    General: Skin is warm and dry.     Coloration: Skin is not jaundiced.  Neurological:     Mental Status: He is alert.    ED Results / Procedures / Treatments   Labs (all labs ordered are listed, but only abnormal results are displayed) Labs Reviewed  BASIC METABOLIC PANEL - Abnormal; Notable for the following components:  Result Value   CO2 19 (*)    All other components within normal limits  CBC WITH DIFFERENTIAL/PLATELET - Abnormal; Notable for the following components:   Hemoglobin 11.9 (*)    HCT 36.1 (*)    RDW 16.7 (*)    nRBC 0.9 (*)    All other components within normal limits  RESP PANEL BY RT-PCR (FLU A&B, COVID) [id number]    EKG None  Radiology DG Finger Middle Right  Result Date: 06/15/2021 CLINICAL DATA:  Right middle finger laceration. EXAM: RIGHT MIDDLE FINGER 2+V COMPARISON:  June 13, 2021 FINDINGS: There is no evidence of fracture or dislocation. There is no evidence of arthropathy or other focal bone abnormality. A small superficial soft tissue defect is again seen along the distal aspect of the third right finger. IMPRESSION: Small superficial distal soft tissue defect without evidence of acute osseous abnormality. Electronically Signed    By: Aram Candela M.D.   On: 06/15/2021 00:44   DG Finger Middle Right  Result Date: 06/13/2021 CLINICAL DATA:  Third right finger laceration. EXAM: RIGHT MIDDLE FINGER 2+V COMPARISON:  None. FINDINGS: There is no evidence of fracture or dislocation. There is no evidence of arthropathy or other focal bone abnormality. A superficial soft tissue defect is seen along the distal aspect of the third right finger. IMPRESSION: Distal soft tissue defect without evidence of acute osseous abnormality. Electronically Signed   By: Aram Candela M.D.   On: 06/13/2021 19:53    Procedures Procedures    Medications Ordered in ED Medications  acetaminophen (TYLENOL) tablet 650 mg (650 mg Oral Given 06/15/21 0033)    ED Course/ Medical Decision Making/ A&P Clinical Course as of 06/15/21 0138  Wed Jun 15, 2021  0021 Temp(!): 101.1 F (38.4 C) [MV]    Clinical Course User Index [MV] Tanda Rockers, PA-C                           Medical Decision Making 41 year old male who presents to the ED today after sustaining a laceration to his right middle finger approximately 2 days ago.  On arrival to the ED today vitals are stable.  It does appear he was seen in the ED yesterday per chart review and had an x-ray done without acute findings.  On my exam he has a healing laceration to the distal aspect along the pad of his right middle finger with some dried blood.  Given the laceration is greater than 24 hours old I do not feel he is a good candidate for suture placement at this time.  We will plan for wound care, dressing.  Patient does report that his tetanus is up-to-date.  History is somewhat limited as patient has not using his voice to respond to answers at this time.  He is nodding yes and no.  This does appear consistent with previous ED visits.  History of schizophrenia.  We will plan to discharge home at this time with PCP follow-up.  Patient instructed on wound care.   12:04 AM Pt received  additional set of vitals prior to being discharged and is found to be febrile at 101.1 Fahrenheit.  Plan to work-up for fever at this time including COVID, flu testing, repeat x-ray of the finger as well as lab work.  The finger does not appear acutely infected at this time however given wound present for 2 days there is consideration for same.  Workup overall reassuring. Will discharge pt  home at this time. Given fever will cover with 5 days of kelfex. Pt instructed to follow up with PCP for further eval. He is in agreement with plan and stable for discharge home.   Amount and/or Complexity of Data Reviewed Labs: ordered.    Details: CBC without leukocytosis. Hgb 11.9; consistent with baseline BMP with bicarb 19. Glucose 82. No gap. Not consistent with DKA. No other electrolyte abnormalities.  COVID and flu negative Radiology: ordered.    Details: No acute findings - most specifically no sign of osteomyelitis    Final Clinical Impression(s) / ED Diagnoses Final diagnoses:  Laceration of right middle finger without foreign body without damage to nail, initial encounter  Febrile illness    Rx / DC Orders ED Discharge Orders          Ordered    cephALEXin (KEFLEX) 500 MG capsule  2 times daily        06/15/21 0138             Discharge Instructions      Given your laceration is > 24 hours old no sutures were placed today. Please see attached instructions on nonsutured laceration care.   I would recommend applying bacitracin to the wound to help with healing. Clean wound with soap and water and keep wound clean/dry.   Take your printed prescription of antibiotics to pharmacy of your choosing and have it filled. Take the medication as prescribed.   Follow up with your PCP for further evaluation  Return to the ED for any new/worsening symptoms         Tanda RockersVenter, Karrissa Parchment, PA-C 06/15/21 [id number]    Geoffery Lyonselo, [place], MD 06/15/21 [phone]

## 2021-06-14 NOTE — Discharge Instructions (Addendum)
Given your laceration is > 24 hours old no sutures were placed today. Please see attached instructions on nonsutured laceration care.   I would recommend applying bacitracin to the wound to help with healing. Clean wound with soap and water and keep wound clean/dry.   Take your printed prescription of antibiotics to pharmacy of your choosing and have it filled. Take the medication as prescribed.   Follow up with your PCP for further evaluation  Return to the ED for any new/worsening symptoms

## 2021-06-14 NOTE — ED Notes (Signed)
Pt called for vitals x2, no response. 

## 2021-06-15 ENCOUNTER — Emergency Department (HOSPITAL_COMMUNITY): Payer: Self-pay

## 2021-06-15 LAB — CBC WITH DIFFERENTIAL/PLATELET
Abs Immature Granulocytes: 0.02 10*3/uL (ref 0.00–0.07)
Basophils Absolute: 0.1 10*3/uL (ref 0.0–0.1)
Basophils Relative: 1 %
Eosinophils Absolute: 0.1 10*3/uL (ref 0.0–0.5)
Eosinophils Relative: 2 %
HCT: 36.1 % — ABNORMAL LOW (ref 39.0–52.0)
Hemoglobin: 11.9 g/dL — ABNORMAL LOW (ref 13.0–17.0)
Immature Granulocytes: 0 %
Lymphocytes Relative: 31 %
Lymphs Abs: 2.3 10*3/uL (ref 0.7–4.0)
MCH: 28.1 pg (ref 26.0–34.0)
MCHC: 33 g/dL (ref 30.0–36.0)
MCV: 85.3 fL (ref 80.0–100.0)
Monocytes Absolute: 0.9 10*3/uL (ref 0.1–1.0)
Monocytes Relative: 12 %
Neutro Abs: 3.9 10*3/uL (ref 1.7–7.7)
Neutrophils Relative %: 54 %
Platelets: 250 10*3/uL (ref 150–400)
RBC: 4.23 MIL/uL (ref 4.22–5.81)
RDW: 16.7 % — ABNORMAL HIGH (ref 11.5–15.5)
WBC: 7.4 10*3/uL (ref 4.0–10.5)
nRBC: 0.9 % — ABNORMAL HIGH (ref 0.0–0.2)

## 2021-06-15 LAB — BASIC METABOLIC PANEL
Anion gap: 11 (ref 5–15)
BUN: 8 mg/dL (ref 6–20)
CO2: 19 mmol/L — ABNORMAL LOW (ref 22–32)
Calcium: 9.3 mg/dL (ref 8.9–10.3)
Chloride: 111 mmol/L (ref 98–111)
Creatinine, Ser: 1.14 mg/dL (ref 0.61–1.24)
GFR, Estimated: >60 mL/min (ref >60)
Glucose, Bld: 82 mg/dL (ref 70–99)
Potassium: 3.7 mmol/L (ref 3.5–5.1)
Sodium: 141 mmol/L (ref 135–145)

## 2021-06-15 LAB — RESP PANEL BY RT-PCR (FLU A&B, COVID) ARPGX2
Influenza A by PCR: NEGATIVE
Influenza B by PCR: NEGATIVE
SARS Coronavirus 2 by RT PCR: NEGATIVE

## 2021-06-15 MED ORDER — CEPHALEXIN 500 MG PO CAPS: 500.0000 mg | ORAL_CAPSULE | Freq: Two times a day (BID) | ORAL | 0 refills | Status: AC

## 2021-06-15 MED ORDER — ACETAMINOPHEN 325 MG PO TABS
650.0000 mg | ORAL_TABLET | Freq: Once | ORAL | Status: AC
  Administered 2021-06-15: 650 mg via ORAL
  Filled 2021-06-15: qty 2

## 2021-06-20 ENCOUNTER — Encounter: Payer: Self-pay | Admitting: *Deleted

## 2021-06-20 NOTE — Congregational Nurse Program (Signed)
°  Dept: [phone]   Congregational Nurse Program Note  Date of Encounter: 06/20/2021  Past Medical History: Past Medical History:  Diagnosis Date   Bipolar 1 disorder (HCC)    Schizophrenia (HCC)    Sickle cell trait (HCC)     Encounter Details:  CNP Questionnaire - 06/20/21 1249       Questionnaire   Do you give verbal consent to treat you today? Yes    Location Patient Served  [facility]    Visit Setting Church or Organization    Patient Status Unknown    Insurance Uninsured ([employer])    Insurance Referral N/A    Medication N/A    Medical Provider Yes    Screening Referrals N/A    Medical Referral N/A    Medical Appointment Made N/A    Food N/A    Transportation N/A    Housing/Utilities No permanent housing    Intervention Support    ED Visit Averted N/A    Life-Saving Intervention Made N/A            Client seen at [facility] day shelter and was called to nurse's office for a followup visit from ED encounter. Client was non verbal during encounter. He nods his head yes and no to questions. No concerns or needs expressed at this time. Ewald Beg W RN CN

## 2023-02-23 ENCOUNTER — Emergency Department (HOSPITAL_COMMUNITY): Payer: Self-pay

## 2023-02-23 ENCOUNTER — Encounter (HOSPITAL_COMMUNITY): Payer: Self-pay

## 2023-02-23 ENCOUNTER — Emergency Department (HOSPITAL_COMMUNITY)
Admission: EM | Admit: 2023-02-23 | Discharge: 2023-02-24 | Disposition: A | Discharge status: Home / Self Care | Payer: Self-pay | Attending: Emergency Medicine | Admitting: Emergency Medicine

## 2023-02-23 ENCOUNTER — Other Ambulatory Visit: Payer: Self-pay

## 2023-02-23 DIAGNOSIS — N492 Inflammatory disorders of scrotum: Secondary | ICD-10-CM | POA: Insufficient documentation

## 2023-02-23 LAB — URINALYSIS, ROUTINE W REFLEX MICROSCOPIC
Bilirubin Urine: NEGATIVE
Glucose, UA: NEGATIVE mg/dL
Ketones, ur: NEGATIVE mg/dL
Leukocytes,Ua: NEGATIVE
Nitrite: NEGATIVE
Protein, ur: NEGATIVE mg/dL
Specific Gravity, Urine: 1.01 (ref 1.005–1.030)
pH: 8 (ref 5.0–8.0)

## 2023-02-23 NOTE — ED Triage Notes (Signed)
Nonverbal and attempts to write reason for visit.   Written is " I let my friend wash my under wear and a wet spot is on scrotum".   Denies drainage, pain, dysuria, or other urological sx.

## 2023-02-23 NOTE — ED Provider Triage Note (Signed)
Emergency Medicine Provider Triage Evaluation Note  Albert Anderson , a 42 y.o. male  was evaluated in triage.  Pt complains of scrotal irritation. Difficult to obtain HPI because patient is mute and writes on paper. Not sexually active. No penile discharge. No urinary symptoms.  Review of Systems  Positive: Testicular irritation Negative: fever  Physical Exam  BP 131/80 (BP Location: Right Arm)   Pulse 73   Temp 98.5 F (36.9 C) (Oral)   Resp 18   Ht 5\' 6"  (1.676 m)   Wt 72.6 kg   SpO2 99%   BMI 25.82 kg/m  Gen:   Awake, no distress   Resp:  Normal effort  MSK:   Moves extremities without difficulty  Other:  GU exam performed with chaperone in room. Irritation to scrotum with mild erythema. No testicular tenderness. No penile discharge.   Medical Decision Making  Medically screening exam initiated at 9:12 PM.  Appropriate orders placed.  Albert Anderson was informed that the remainder of the evaluation will be completed by another provider, this initial triage assessment does not replace that evaluation, and the importance of remaining in the ED until their evaluation is complete.  [place] [facility]   Mannie Stabile, [place] 02/23/23 2114

## 2023-02-24 MED ORDER — CEPHALEXIN 500 MG PO CAPS: 500.0000 mg | ORAL_CAPSULE | Freq: Two times a day (BID) | ORAL | 0 refills | Status: AC

## 2023-02-24 MED ORDER — CEPHALEXIN 500 MG PO CAPS
500.0000 mg | ORAL_CAPSULE | Freq: Once | ORAL | Status: AC
  Administered 2023-02-24: 500 mg via ORAL
  Filled 2023-02-24: qty 1

## 2023-02-24 NOTE — Discharge Instructions (Addendum)
You have been seen today for your complaint of scrotal swelling. Your lab work was overall reassuring. Your imaging was reassuring and showed no emergent abnormalities. Your discharge medications include Keflex This is an antibiotic. You should take it as prescribed. You should take it for the entire duration of the prescription. This may cause an upset stomach. This is normal. You may take this with food. You may also eat yogurt to prevent diarrhea. Follow up with: Dr. Lafonda Mosses.  Call to schedule an appointment for follow-up. Please seek immediate medical care if you develop any of the following symptoms: You notice red streaks coming from the infected area. You notice the skin turns purple or black and falls off. At this time there does not appear to be the presence of an emergent medical condition, however there is always the potential for conditions to change. Please read and follow the below instructions.  Do not take your medicine if  develop an itchy rash, swelling in your mouth or lips, or difficulty breathing; call 911 and seek immediate emergency medical attention if this occurs.  You may review your lab tests and imaging results in their entirety on your MyChart account.  Please discuss all results of fully with your primary care provider and other specialist at your follow-up visit.  Note: Portions of this text may have been transcribed using voice recognition software. Every effort was made to ensure accuracy; however, inadvertent computerized transcription errors may still be present.

## 2023-02-24 NOTE — ED Notes (Signed)
Pt non-verbal on assessment. Pt given urine specimen cup and urinal and told to alert staff if he needs to urinate. Pt nodded to demonstrate understanding of same

## 2023-02-24 NOTE — ED Provider Notes (Signed)
[facility] AT [facility] Provider Note   CSN: [id number] Arrival date & time: 02/23/23  2038     History  No chief complaint on file.   Albert Anderson is a 42 y.o. male.  Presenting for evaluation of his brain mass.  He noticed this 2 days ago.  No scrotal or penile pain.  No dysuria, frequency, urgency, penile discharge.  He reports the scrotum is larger than normal.  He reports never being sexually active.  Denies any fevers or chills.  Patient does not verbally communicate.  Communicates by writing on a piece of paper.   HPI     Home Medications Prior to Admission medications   Medication Sig Start Date End Date Taking? Authorizing Provider  cephALEXin (KEFLEX) 500 MG capsule Take 1 capsule (500 mg total) by mouth 2 (two) times daily for 7 days. 02/24/23 03/03/23 Yes Brynna Dobos, Edsel Petrin, PA-C  bacitracin ointment Apply 1 application topically 2 (two) times daily. 03/01/21   Arby Barrette, MD  diphenhydrAMINE (BENADRYL) 25 MG tablet Take 1 tablet (25 mg total) by mouth every 6 (six) hours as needed. 11/22/19   Dartha Lodge, PA-C  famotidine (PEPCID) 20 MG tablet Take 1 tablet (20 mg total) by mouth 2 (two) times daily. 11/22/19   Dartha Lodge, PA-C      Allergies    Patient has no known allergies.    Review of Systems   Review of Systems  Genitourinary:  Positive for genital sores.  All other systems reviewed and are negative.   Physical Exam Updated Vital Signs BP (!) 146/126   Pulse 74   Temp 98.5 F (36.9 C) (Oral)   Resp 14   Ht 5\' 6"  (1.676 m)   Wt 72.6 kg   SpO2 97%   BMI 25.82 kg/m  Physical Exam Vitals and nursing note reviewed. Exam conducted with a chaperone present Thayer Ohm, Char[employer] fundraiser).  Constitutional:      General: He is not in acute distress.    Appearance: Normal appearance. He is normal weight. He is not ill-appearing.  HENT:     Head: Normocephalic and atraumatic.  Pulmonary:     Effort: Pulmonary effort is  normal. No respiratory distress.  Abdominal:     General: Abdomen is flat.  Genitourinary:    Comments: Right hemiscrotum minimally enlarged with overlying erythema when compared to left. No scrotal ttp. No penile discharge or blood. Cremasteric reflexes intact. No obvious masses. Musculoskeletal:        General: Normal range of motion.     Cervical back: Neck supple.  Skin:    General: Skin is warm and dry.  Neurological:     Mental Status: He is alert and oriented to person, place, and time.  Psychiatric:        Mood and Affect: Mood normal.        Behavior: Behavior normal.     ED Results / Procedures / Treatments   Labs (all labs ordered are listed, but only abnormal results are displayed) Labs Reviewed  URINALYSIS, ROUTINE W REFLEX MICROSCOPIC - Abnormal; Notable for the following components:      Result Value   APPearance HAZY (*)    Hgb urine dipstick SMALL (*)    Bacteria, UA RARE (*)    All other components within normal limits    EKG None  Radiology US SCROTUM W/DOPPLER  Result Date: 02/23/2023 CLINICAL DATA:  Irritation EXAM: SCROTAL ULTRASOUND DOPPLER ULTRASOUND OF THE TESTICLES  TECHNIQUE: Complete ultrasound examination of the testicles, epididymis, and other scrotal structures was performed. Color and spectral Doppler ultrasound were also utilized to evaluate blood flow to the testicles. COMPARISON:  None Available. FINDINGS: Right testicle Measurements: 2.4 x 4.2 x 2.0 cm. Testicular microlithiasis noted. No testicular mass or other focal abnormality. Left testicle Measurements: 2.2 x 4.2 x 1.9 cm. Testicular microlithiasis noted. No testicular mass or focal abnormality. Right epididymis:  Normal in size and appearance. Left epididymis:  Normal in size and appearance. Hydrocele:  None visualized. Varicocele:  None visualized. Pulsed Doppler interrogation of both testes demonstrates normal low resistance arterial and venous waveforms bilaterally. IMPRESSION: 1.  Bilateral testicular microlithiasis. Current literature suggests that testicular microlithiasis is not a significant independent risk factor for development of testicular carcinoma, and that follow up imaging is not warranted in the absence of other risk factors. Monthly testicular self-examination and annual physical exams are considered appropriate surveillance. If patient has other risk factors for testicular carcinoma, then referral to Urology should be considered. (Reference: DeCastro, et al.: A 5-Year Follow up Study of Asymptomatic Men with Testicular Microlithiasis. J Urol 2008; 179:1420-1423.) 2. No acute testicular findings. No sonographic evidence of torsion. Electronically Signed   By: Sharlet Salina M.D.   On: 02/23/2023 23:13    Procedures Procedures    Medications Ordered in ED Medications  cephALEXin (KEFLEX) capsule 500 mg (has no administration in time range)    ED Course/ Medical Decision Making/ A&P                                 Medical Decision Making Risk Prescription drug management.   This patient presents to the ED for concern of testicle swelling, this involves an extensive number of treatment options, and is a complaint that carries with it a high risk of complications and morbidity. The differential diagnosis of Emergent testicle pain/swelling includes but is not limited to Cellulitis, Fournier's gangrene, epididymitis, orchitis, testicular torsion, appendage torsion, trauma, indirect hernia.   My initial workup includes urinalysis, [place]  Additional history obtained from: Nursing notes from this visit.  I ordered, reviewed and interpreted labs which include: urinalysis. Triple phosphate crystals present, otherwise non infected appearing urine  I ordered imaging studies including scrotal US I independently visualized and interpreted imaging which showed bilateral microlithiasis, no other abnormal findings I agree with the radiologist  interpretation  Afebrile, hemodynamically stable.  42 year old male presenting to the ED for evaluation of right sided testicle swelling.  This is nonpainful.  On evaluation, he does have some erythema and swelling of the right testicle.  Urine looks uninfected.  He shows no other signs or symptoms of systemic infection.  No urinary symptoms.  Not sexually active.  No evidence of Fournier's gangrene at this time.  Will treat for scrotal cellulitis with Keflex.  He also given a urology referral.  He was given strict return precautions.  First dose of Keflex given in the ED.  Stable at discharge.  At this time there does not appear to be any evidence of an acute emergency medical condition and the patient appears stable for discharge with appropriate outpatient follow up. Diagnosis was discussed with patient who verbalizes understanding of care plan and is agreeable to discharge. I have discussed return precautions with patient who verbalizes understanding. Patient encouraged to follow-up with urology as soon as possible . All questions answered.  Note: Portions of this report may have  been transcribed using voice recognition software. Every effort was made to ensure accuracy; however, inadvertent computerized transcription errors may still be present.        Final Clinical Impression(s) / ED Diagnoses Final diagnoses:  Cellulitis of scrotum    Rx / DC Orders ED Discharge Orders          Ordered    cephALEXin (KEFLEX) 500 MG capsule  2 times daily        02/24/23 0231              Michelle Piper, PA-C 02/24/23 0231    Nira Conn, MD 02/24/23 [phone]

## 2023-03-13 NOTE — Progress Notes (Signed)
.  Flulaval administered left deltoid/pt tolerated injection well

## 2023-04-24 NOTE — Progress Notes (Signed)
Erroneous encounter

## 2023-08-05 IMAGING — DX DG FINGER MIDDLE 2+V*R*
3 series · 3 of 3 positions shown · non-contrast
Comparison: June 13, 2021

CLINICAL DATA: Right middle finger laceration.

EXAM:
RIGHT MIDDLE FINGER 2+V

[finger ap]
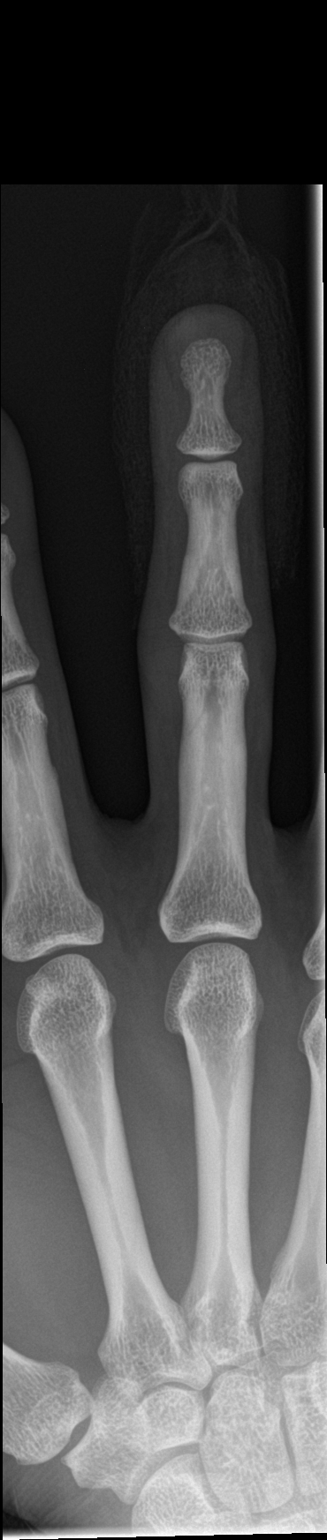

[finger obl]
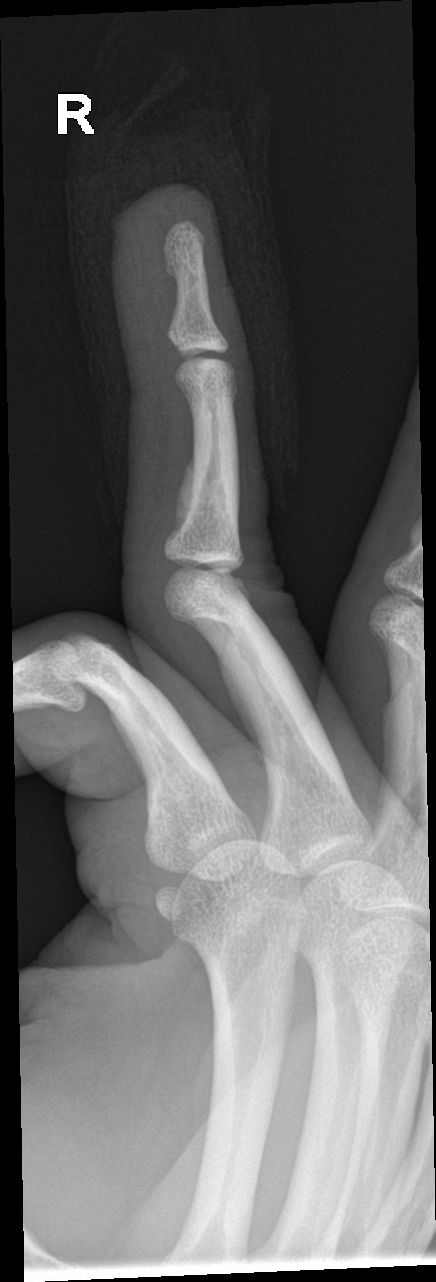

[finger lat]
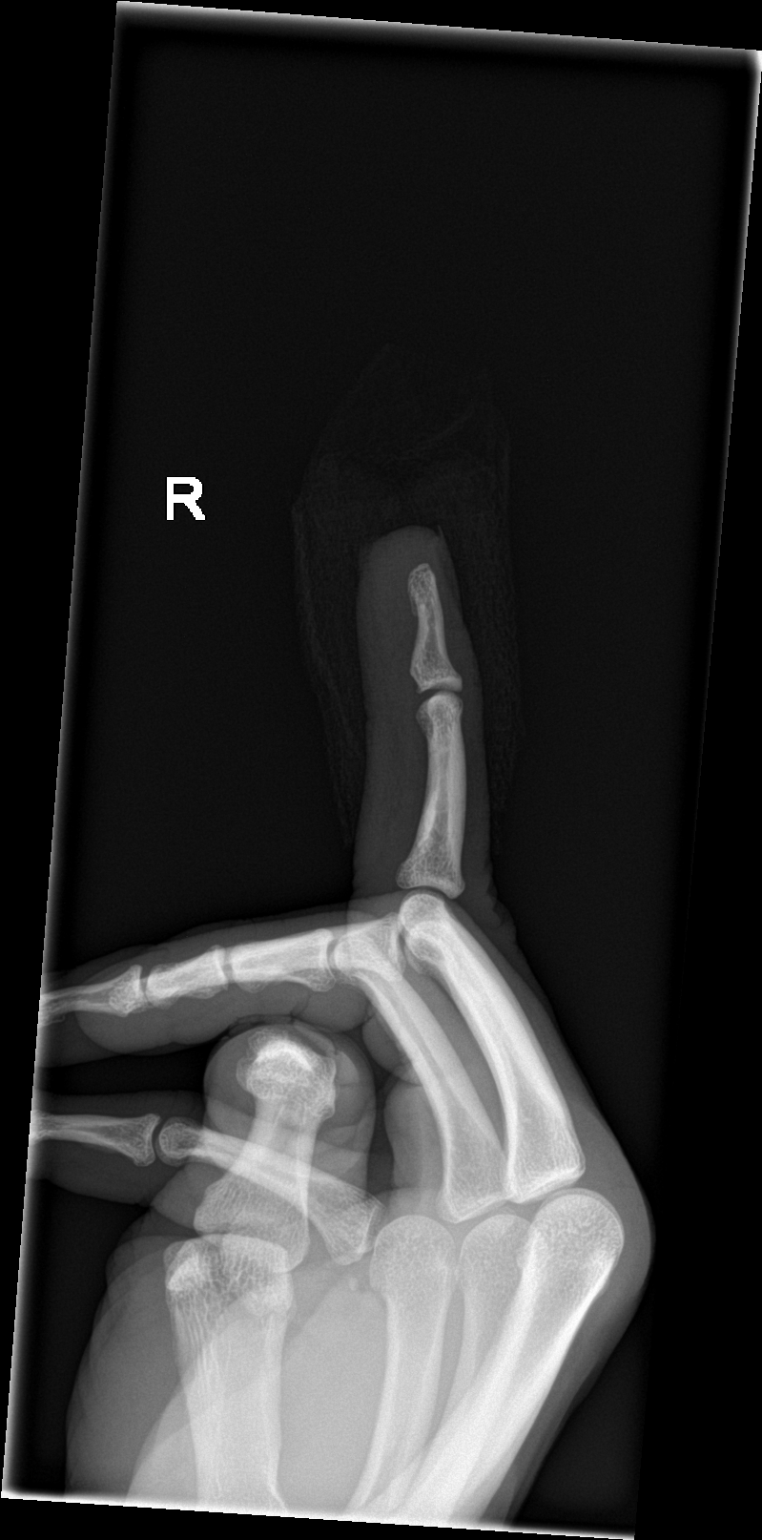

[3 of 3 positions shown; findings below may reference images not displayed]

FINDINGS: There is no evidence of fracture or dislocation. There is no
evidence of arthropathy or other focal bone abnormality. A small
superficial soft tissue defect is again seen along the distal aspect
of the third right finger.
IMPRESSION: Small superficial distal soft tissue defect without evidence of
acute osseous abnormality.
